# Patient Record
Sex: Female | Born: 1950 | Race: White | Hispanic: No | Smoking: Never smoker
Health system: Southern US, Community
[De-identification: ages and names within clinical notes are randomized; demographics above are authoritative.]

## PROBLEM LIST (undated history)

## (undated) DIAGNOSIS — K589 Irritable bowel syndrome without diarrhea: Secondary | ICD-10-CM

## (undated) DIAGNOSIS — D804 Selective deficiency of immunoglobulin M [IgM]: Secondary | ICD-10-CM

## (undated) DIAGNOSIS — D801 Nonfamilial hypogammaglobulinemia: Secondary | ICD-10-CM

## (undated) DIAGNOSIS — D802 Selective deficiency of immunoglobulin A [IgA]: Secondary | ICD-10-CM

## (undated) DIAGNOSIS — D803 Selective deficiency of immunoglobulin G [IgG] subclasses: Secondary | ICD-10-CM

## (undated) DIAGNOSIS — T7840XA Allergy, unspecified, initial encounter: Secondary | ICD-10-CM

## (undated) DIAGNOSIS — I10 Essential (primary) hypertension: Secondary | ICD-10-CM

## (undated) DIAGNOSIS — K5792 Diverticulitis of intestine, part unspecified, without perforation or abscess without bleeding: Secondary | ICD-10-CM

## (undated) HISTORY — DX: Selective deficiency of immunoglobulin m (igm): D80.4

## (undated) HISTORY — PX: CHOLECYSTECTOMY: SHX55

## (undated) HISTORY — DX: Nonfamilial hypogammaglobulinemia: D80.1

## (undated) HISTORY — DX: Diverticulitis of intestine, part unspecified, without perforation or abscess without bleeding: K57.92

## (undated) HISTORY — PX: COLONOSCOPY: SHX174

## (undated) HISTORY — DX: Selective deficiency of immunoglobulin g (igg) subclasses: D80.3

## (undated) HISTORY — DX: Irritable bowel syndrome, unspecified: K58.9

## (undated) HISTORY — DX: Selective deficiency of immunoglobulin a (iga): D80.2

## (undated) HISTORY — DX: Allergy, unspecified, initial encounter: T78.40XA

---

## 1997-04-15 ENCOUNTER — Other Ambulatory Visit: Admission: RE | Admit: 1997-04-15 | Discharge: 1997-04-15 | Payer: Self-pay | Admitting: *Deleted

## 1999-01-26 ENCOUNTER — Emergency Department (HOSPITAL_COMMUNITY): Admission: EM | Admit: 1999-01-26 | Discharge: 1999-01-27 | Payer: Self-pay | Admitting: Emergency Medicine

## 1999-01-27 ENCOUNTER — Encounter: Payer: Self-pay | Admitting: Emergency Medicine

## 1999-03-22 ENCOUNTER — Other Ambulatory Visit: Admission: RE | Admit: 1999-03-22 | Discharge: 1999-03-22 | Payer: Self-pay | Admitting: *Deleted

## 2000-10-16 ENCOUNTER — Other Ambulatory Visit: Admission: RE | Admit: 2000-10-16 | Discharge: 2000-10-16 | Payer: Self-pay | Admitting: *Deleted

## 2000-11-11 ENCOUNTER — Emergency Department (HOSPITAL_COMMUNITY): Admission: EM | Admit: 2000-11-11 | Discharge: 2000-11-11 | Payer: Self-pay | Admitting: Emergency Medicine

## 2002-01-03 ENCOUNTER — Other Ambulatory Visit: Admission: RE | Admit: 2002-01-03 | Discharge: 2002-01-03 | Payer: Self-pay | Admitting: *Deleted

## 2002-02-15 ENCOUNTER — Emergency Department (HOSPITAL_COMMUNITY): Admission: EM | Admit: 2002-02-15 | Discharge: 2002-02-15 | Payer: Self-pay | Admitting: Emergency Medicine

## 2002-02-15 ENCOUNTER — Encounter: Payer: Self-pay | Admitting: Emergency Medicine

## 2002-07-18 ENCOUNTER — Ambulatory Visit (HOSPITAL_COMMUNITY): Admission: RE | Admit: 2002-07-18 | Discharge: 2002-07-18 | Payer: Self-pay | Admitting: Gastroenterology

## 2002-07-18 ENCOUNTER — Encounter (INDEPENDENT_AMBULATORY_CARE_PROVIDER_SITE_OTHER): Payer: Self-pay | Admitting: Specialist

## 2002-10-17 ENCOUNTER — Encounter: Admission: RE | Admit: 2002-10-17 | Discharge: 2002-10-17 | Payer: Self-pay | Admitting: Gastroenterology

## 2002-10-17 ENCOUNTER — Encounter: Payer: Self-pay | Admitting: Gastroenterology

## 2003-02-06 ENCOUNTER — Other Ambulatory Visit: Admission: RE | Admit: 2003-02-06 | Discharge: 2003-02-06 | Payer: Self-pay | Admitting: *Deleted

## 2004-02-06 ENCOUNTER — Ambulatory Visit (HOSPITAL_COMMUNITY): Admission: RE | Admit: 2004-02-06 | Discharge: 2004-02-06 | Payer: Self-pay | Admitting: Orthopedic Surgery

## 2005-09-27 ENCOUNTER — Ambulatory Visit (HOSPITAL_COMMUNITY): Admission: RE | Admit: 2005-09-27 | Discharge: 2005-09-27 | Payer: Self-pay | Admitting: Obstetrics and Gynecology

## 2005-10-11 ENCOUNTER — Encounter: Admission: RE | Admit: 2005-10-11 | Discharge: 2005-10-11 | Payer: Self-pay | Admitting: Obstetrics and Gynecology

## 2006-07-12 ENCOUNTER — Encounter: Admission: RE | Admit: 2006-07-12 | Discharge: 2006-07-12 | Payer: Self-pay | Admitting: Gastroenterology

## 2007-08-15 ENCOUNTER — Encounter: Admission: RE | Admit: 2007-08-15 | Discharge: 2007-08-15 | Payer: Self-pay | Admitting: *Deleted

## 2007-08-30 ENCOUNTER — Encounter: Admission: RE | Admit: 2007-08-30 | Discharge: 2007-08-30 | Payer: Self-pay | Admitting: Surgery

## 2007-09-22 ENCOUNTER — Emergency Department (HOSPITAL_COMMUNITY): Admission: EM | Admit: 2007-09-22 | Discharge: 2007-09-23 | Payer: Self-pay | Admitting: Emergency Medicine

## 2009-02-20 IMAGING — CT CT ABDOMEN W/ CM
2 of 5 series · 17 of 46 positions shown, 19 images · IV contrast (APPLIED)
Comparison: 07/12/2006

CT ABDOMEN

CLINICAL DATA: Abdominal pain, nausea, right upper quadrant pain,
recent cholecystectomy

CT ABDOMEN AND PELVIS WITH CONTRAST
TECHNIQUE: Multidetector CT imaging of the abdomen and pelvis was
performed using the standard protocol following bolus
administration of intravenous contrast. Sagittal and coronal images
reconstructed from axial data set.
Contrast: Dilute oral contrast and 125 ml 3mnipaque-3SS

[Series 2: abd_pel 5.0 b40f st · axial · 0.64mm/px · z∈[-408,-32]mm · 14 of 85 slices shown, 16 images]
[im 5/85  soft-tissue]
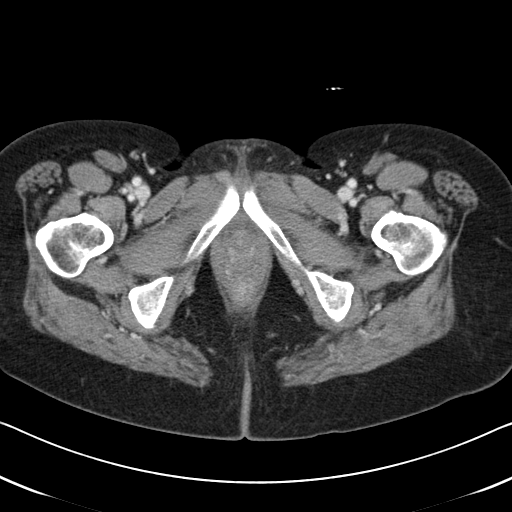
[im 5/85  bone]
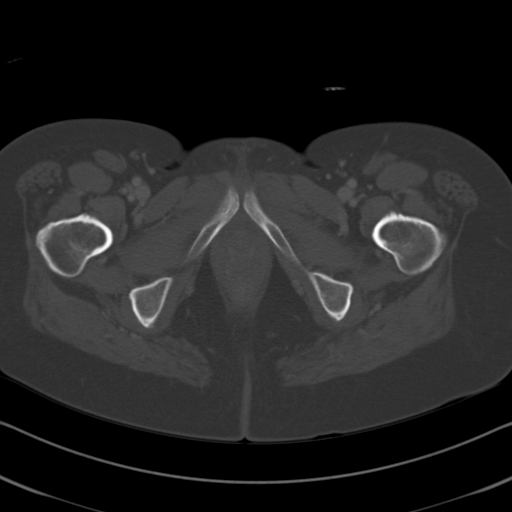
[im 9/85  soft-tissue]
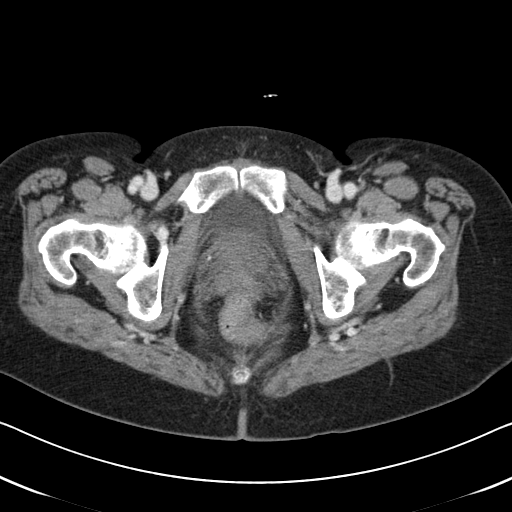
[im 18/85  soft-tissue]
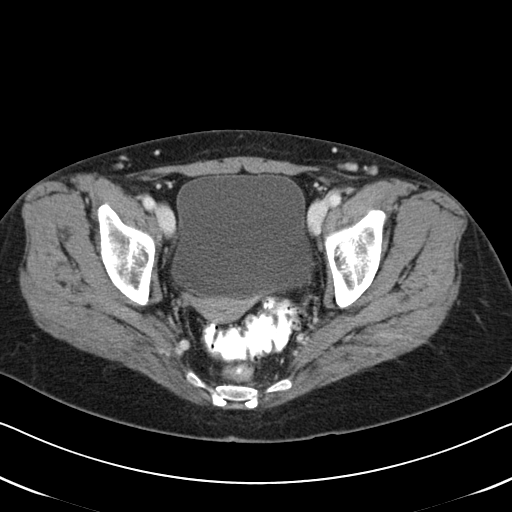
[im 23/85  soft-tissue]
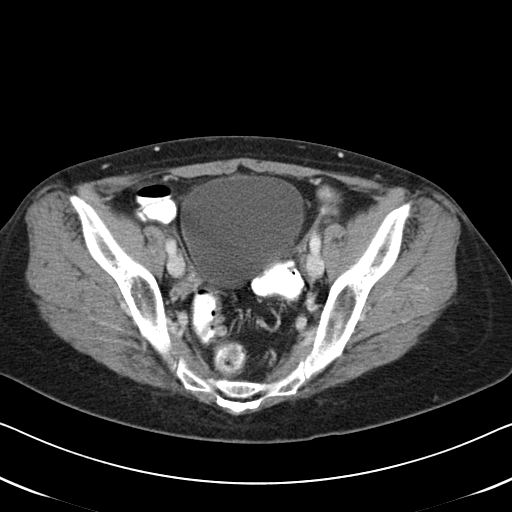
[im 27/85  soft-tissue]
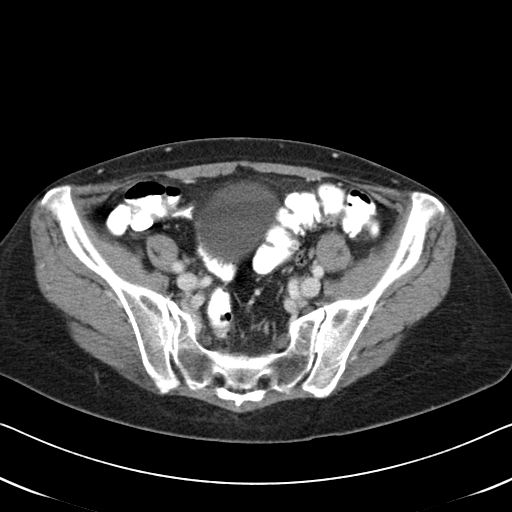
[im 36/85  soft-tissue]
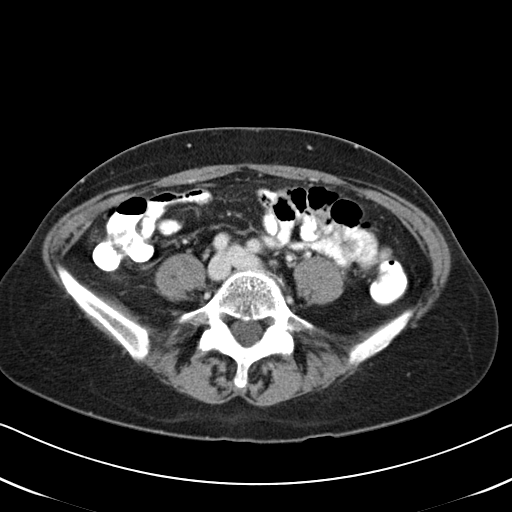
[im 40/85  soft-tissue]
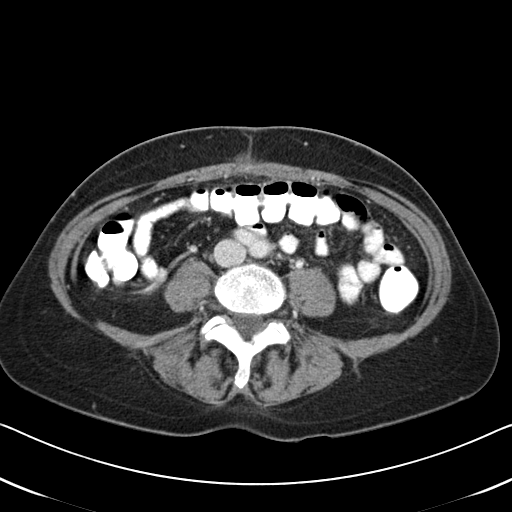
[im 45/85  soft-tissue]
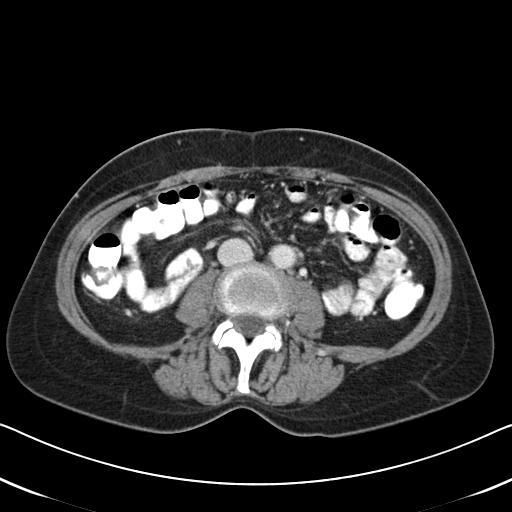
[im 49/85  soft-tissue]
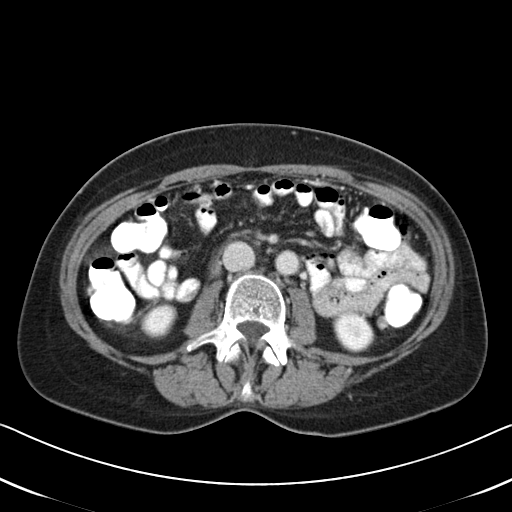
[im 49/85  bone]
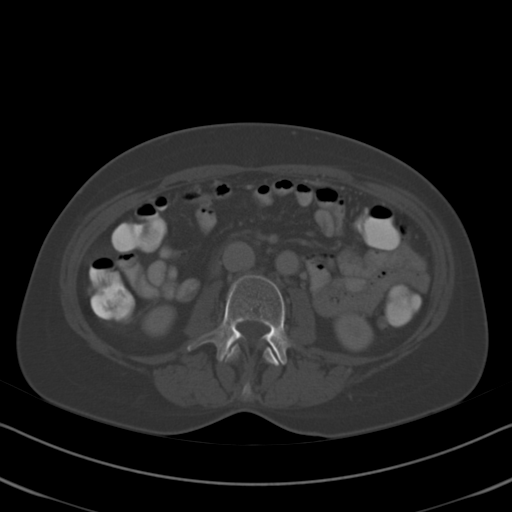
[im 58/85  soft-tissue]
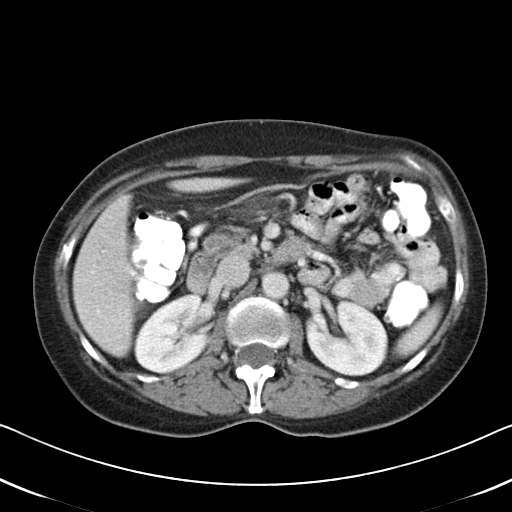
[im 62/85  soft-tissue]
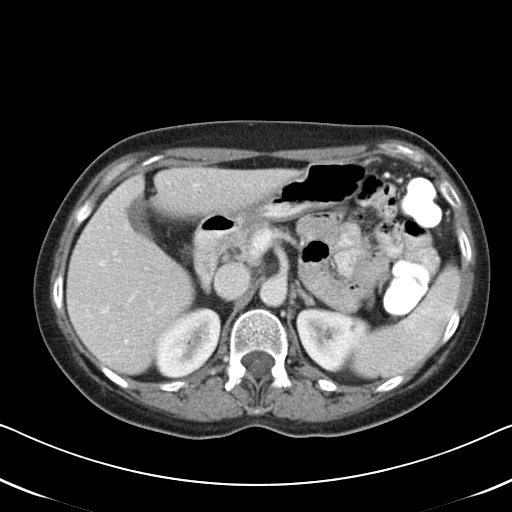
[im 67/85  soft-tissue]
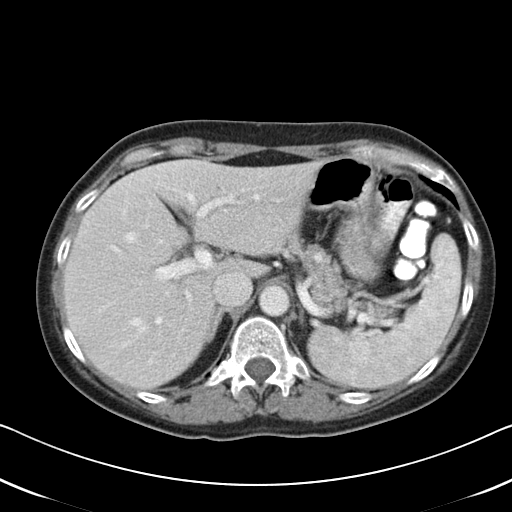
[im 76/85  soft-tissue]
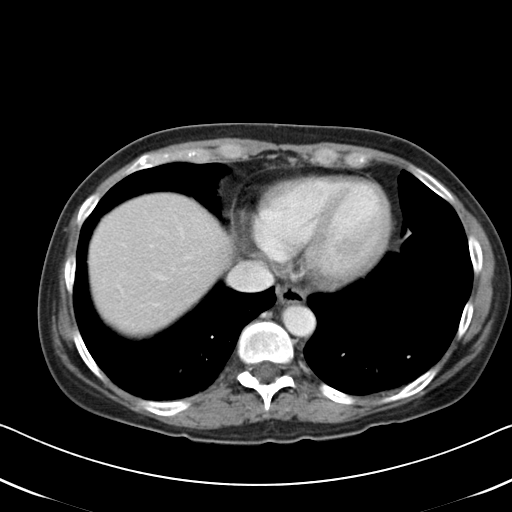
[im 80/85  soft-tissue]
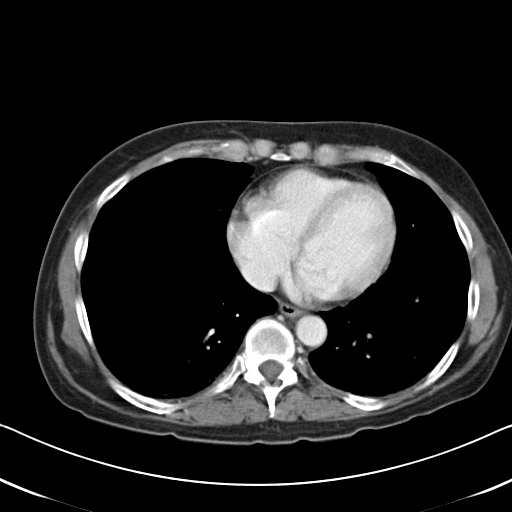

[Series 602: coronal abdmoen · coronal · 0.86mm/px · 3 of 102 slices shown]
[im 34/102  soft-tissue]
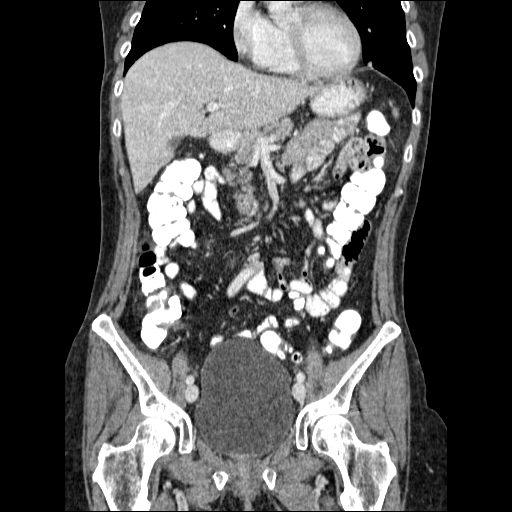
[im 45/102  soft-tissue]
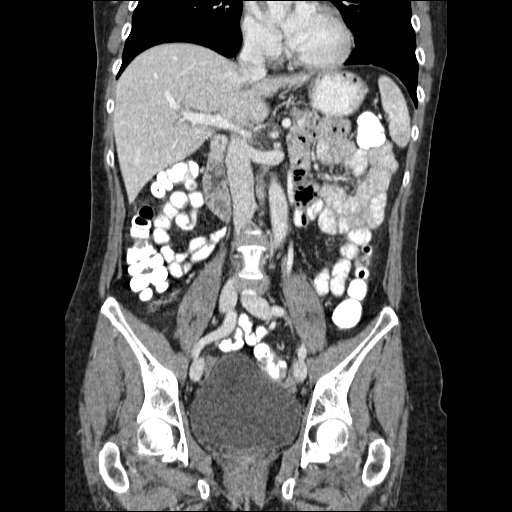
[im 57/102  soft-tissue]
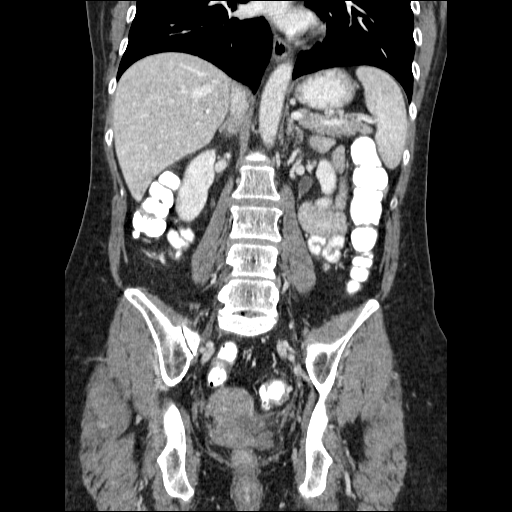

[17 of 46 positions shown; findings below may reference images not displayed]

FINDINGS: Minimal atelectasis dependently right lower lobe.
Status post cholecystectomy with minimal fluid in gallbladder
fossa, 2.4 x 1.0 cm image 24.
Minimal stranding of right upper quadrant tissue planes compatible
with recent surgery.
No focal abnormalities of the liver, spleen, pancreas, kidneys, or
adrenal glands.
Stomach and upper abdominal bowel loops normal.
No definite mass, adenopathy, or free fluid.
IMPRESSION: Postsurgical changes of cholecystectomy with minimal fluid at
gallbladder fossa.
No other upper abdominal abnormalities.

CT PELVIS
FINDINGS: Mild sigmoid diverticulosis without evidence of diverticulitis.
Unremarkable appearance of well distended bladder, uterus, and
adnexae.
Pelvic bowel loops otherwise unremarkable.
No pelvic mass, adenopathy, or free fluid.
Minimal infiltrative changes in the subcutaneous fat extending to
the anterior abdominal wall fascia and umbilicus, compatible with
preceding laparoscopic procedure.
Bones unremarkable.
IMPRESSION: No acute intrapelvic abnormalities.
Mild sigmoid diverticulosis.

## 2010-02-14 ENCOUNTER — Encounter: Payer: Self-pay | Admitting: Obstetrics and Gynecology

## 2010-02-14 ENCOUNTER — Encounter: Payer: Self-pay | Admitting: Family Medicine

## 2010-04-15 ENCOUNTER — Other Ambulatory Visit: Payer: Self-pay | Admitting: Gastroenterology

## 2010-04-15 DIAGNOSIS — R197 Diarrhea, unspecified: Secondary | ICD-10-CM

## 2010-04-19 ENCOUNTER — Ambulatory Visit
Admission: RE | Admit: 2010-04-19 | Discharge: 2010-04-19 | Disposition: A | Discharge status: Home / Self Care | Payer: BC Managed Care – PPO | Source: Ambulatory Visit | Attending: Gastroenterology | Admitting: Gastroenterology

## 2010-04-19 DIAGNOSIS — R197 Diarrhea, unspecified: Secondary | ICD-10-CM

## 2010-04-19 MED ORDER — IOHEXOL 300 MG/ML  SOLN
100.0000 mL | Freq: Once | INTRAMUSCULAR | Status: AC | PRN
  Administered 2010-04-19: 100 mL via INTRAVENOUS

## 2010-06-11 NOTE — Op Note (Signed)
NAME:  Amanda Mayer, Amanda Mayer                        ACCOUNT NO.:  [id number]   MEDICAL RECORD NO.:  [id number]                   PATIENT TYPE:  AMB   LOCATION:  ENDO                                 FACILITY:  [facility]   PHYSICIAN:  Petra Kuba, M.D.                 DATE OF BIRTH:  May 30, 1950   DATE OF PROCEDURE:  07/18/2002  DATE OF DISCHARGE:                                 OPERATIVE REPORT   PROCEDURE:  Colonoscopy with biopsy.   INDICATIONS:  Lower bowel gas, due for colonic screening.  Consent was  signed after risks, benefits, methods, and options thoroughly discussed in  the office.   MEDICINES USED:  Demerol 80 mg, Versed 8 mg.   PROCEDURE:  Rectal inspection was pertinent for external hemorrhoids, small.  Digital exam was negative.  The pediatric video adjustable colonoscope was  inserted and advanced around the colon to the cecum.  This did not require  any abdominal pressure or any position changes.  On insertion, left and  right diverticula were seen.  The cecum was identified by the appendiceal  orifice and the ileocecal valve; in fact, the scope was inserted a short way  into the terminal ileum, which was normal.  Photo documentation was  obtained, and the scope was slowly withdrawn.  Prep was adequate.  There was  some liquid stool that required washing and suctioning.  On slow withdrawal  through the colon, scattered diverticula throughout were confirmed on both  the right and left sides.  In the distal sigmoid a tiny probable  hyperplastic-appearing polyp was seen and was cold biopsied x2.  The scope  was further withdrawn, no other abnormalities were seen as we slowly drew  back to the rectum.  Once back in the rectum the scope was retroflexed,  which revealed some small internal hemorrhoids.  Anorectal pull-through  confirmed the above.  The scope was reinserted a short way up the left side  of the colon, air was suctioned, and the scope removed.  The patient  tolerated the procedure well, and there was no obvious immediate  complication.   ENDOSCOPIC DIAGNOSES:  1. Internal-external hemorrhoids.  2. Scattered left and right diverticula.  3. Tiny distal sigmoid polyp, cold biopsied.  4. Otherwise within normal limits to the terminal ileum.   PLAN:  Await pathology to determine future colonic screening.  Probably  recheck in five years.  Follow up p.r.n. or in two to three months to  recheck symptoms and make sure no further workup plans or medicine options  are needed.                                               Petra Kuba, M.D.    MEM/MEDQ  D:  07/18/2002  T:  07/18/2002  Job:  [id number]

## 2010-06-30 ENCOUNTER — Other Ambulatory Visit: Payer: Self-pay | Admitting: Gastroenterology

## 2012-02-25 DIAGNOSIS — A071 Giardiasis [lambliasis]: Secondary | ICD-10-CM | POA: Insufficient documentation

## 2013-12-31 ENCOUNTER — Emergency Department (HOSPITAL_COMMUNITY)
Admission: EM | Admit: 2013-12-31 | Discharge: 2013-12-31 | Disposition: A | Discharge status: Home / Self Care | Payer: BC Managed Care – PPO | Attending: Emergency Medicine | Admitting: Emergency Medicine

## 2013-12-31 ENCOUNTER — Emergency Department (HOSPITAL_COMMUNITY): Payer: BC Managed Care – PPO

## 2013-12-31 ENCOUNTER — Encounter (HOSPITAL_COMMUNITY): Payer: Self-pay | Admitting: Emergency Medicine

## 2013-12-31 DIAGNOSIS — Z23 Encounter for immunization: Secondary | ICD-10-CM | POA: Diagnosis not present

## 2013-12-31 DIAGNOSIS — W231XXA Caught, crushed, jammed, or pinched between stationary objects, initial encounter: Secondary | ICD-10-CM | POA: Diagnosis not present

## 2013-12-31 DIAGNOSIS — S61200A Unspecified open wound of right index finger without damage to nail, initial encounter: Secondary | ICD-10-CM | POA: Diagnosis not present

## 2013-12-31 DIAGNOSIS — Y9389 Activity, other specified: Secondary | ICD-10-CM | POA: Diagnosis not present

## 2013-12-31 DIAGNOSIS — T1490XA Injury, unspecified, initial encounter: Secondary | ICD-10-CM

## 2013-12-31 DIAGNOSIS — S61209A Unspecified open wound of unspecified finger without damage to nail, initial encounter: Secondary | ICD-10-CM

## 2013-12-31 DIAGNOSIS — Y998 Other external cause status: Secondary | ICD-10-CM | POA: Diagnosis not present

## 2013-12-31 DIAGNOSIS — S61210A Laceration without foreign body of right index finger without damage to nail, initial encounter: Secondary | ICD-10-CM | POA: Diagnosis present

## 2013-12-31 DIAGNOSIS — I10 Essential (primary) hypertension: Secondary | ICD-10-CM | POA: Insufficient documentation

## 2013-12-31 DIAGNOSIS — Y9289 Other specified places as the place of occurrence of the external cause: Secondary | ICD-10-CM | POA: Diagnosis not present

## 2013-12-31 HISTORY — DX: Essential (primary) hypertension: I10

## 2013-12-31 MED ORDER — HYDROCODONE-ACETAMINOPHEN 5-325 MG PO TABS: 1.0000 | ORAL_TABLET | Freq: Four times a day (QID) | ORAL | Status: DC | PRN

## 2013-12-31 MED ORDER — HYDROCODONE-ACETAMINOPHEN 5-325 MG PO TABS
1.0000 | ORAL_TABLET | Freq: Once | ORAL | Status: AC
  Administered 2013-12-31: 1 via ORAL
  Filled 2013-12-31: qty 1

## 2013-12-31 MED ORDER — TETANUS-DIPHTH-ACELL PERTUSSIS 5-2.5-18.5 LF-MCG/0.5 IM SUSP
0.5000 mL | Freq: Once | INTRAMUSCULAR | Status: AC
  Administered 2013-12-31: 0.5 mL via INTRAMUSCULAR
  Filled 2013-12-31: qty 0.5

## 2013-12-31 NOTE — Discharge Instructions (Signed)
Deep Skin Avulsion °A deep skin avulsion is when all layers of the skin or parts of body structures have been torn away. This is usually a result of severe injury (trauma). A deep skin avulsion can include damage to important structures beneath the skin such as tendons, ligaments, nerves, or blood vessels.  °CAUSES  °Many injuries can lead to a deep skin avulsion. These include:  °· Crush injuries. °· Bites. °· Falls against jagged surfaces. °· Gunshot wounds. °· Severe burns and injuries involving dragging (such as those from a bicycle or motorcycle accident). °TREATMENT  °· If the wound is small and there is no damage to vital structures like nerves and blood vessels, the damaged tissues may be removed. Then, the wound can be cleaned thoroughly and closed. °· A skin graft may be performed. This is a procedure in which the outer layer of skin is removed from a different part of your body. That skin (skin graft) is used to cover the open wound. This can happen after damaged tissue is removed and repairs are completed. °· Your caregiver may only apply a bandage (dressing) to the wound. The wound will be kept clean and allowed to heal. Healing can take weeks or months and usually leaves a large scar. This type of treatment is only done if your caregiver feels that skin grafting or a similar procedure would not work. °You might need a tetanus shot if: °· You cannot remember when you had your last tetanus shot. °· You have never had a tetanus shot. °· The injury broke your skin. °If you got a tetanus shot, your arm may swell, get red, and feel warm to the touch. This is common and not a problem. If you need a tetanus shot and you choose not to have one, there is a rare chance of getting tetanus. Sickness from tetanus can be serious. °HOME CARE INSTRUCTIONS  °· Only take over-the-counter or prescription medicines for pain, discomfort, or fever as directed by your caregiver. °· Gently wash the area with mild soap and  water 2 times a day, or as directed. Rinse off the soap. Pat the area dry with a clean towel. Do not rub the wound. This may cause bleeding. °· Follow your caregiver's instructions for how often you need to change the dressing. °· Apply ointment and a dressing to the wound as directed. °· If the dressing sticks, moisten it with soapy water and gently remove it. °· Change the bandage right away if it becomes wet, dirty, or starts to smell bad. °· Take showers. Do not take tub baths, swim, or do anything that may soak the wound until it is healed. °· Use anti-itch medicine as directed by your caregiver. The wound may itch when it is healing. Do not pick or scratch at the wound. °· Follow up with your caregiver for stitches (sutures), staple, or skin adhesive strip removal. °SEEK MEDICAL CARE IF:  °· You have redness, swelling, or increasing pain in your wound. °· A red streak or line extends away from the wound. °· You have pus coming from the wound. °· You notice a bad smell coming from the wound or dressing. °· The wound breaks open (edges not staying together) after sutures have been removed. °· You notice something coming out of the wound, such as a small piece of wood, glass, or metal. °· You are unable to properly move a finger or toe if the wound is on your hand or foot. °· You have severe   swelling around the wound that causes pain and numbness. °· Your arm, hand, leg, or foot changes color. °SEEK IMMEDIATE MEDICAL CARE IF:  °· Your pain becomes severe or is not adequately relieved with pain medicine. °· You have a fever. °· You have nausea and vomiting for more than 24 hours. °· You feel lightheaded, weak, or faint. °· You develop chest pain or difficulty breathing. °MAKE SURE YOU:  °· Understand these instructions. °· Will watch your condition. °· Will get help right away if you are not doing well or get worse. °Document Released: 03/08/2006 Document Revised: 04/04/2011 Document Reviewed:  05/16/2010 °ExitCare® Patient Information ©2015 [employer]. This information is not intended to replace advice given to you by your health care provider. Make sure you discuss any questions you have with your health care provider. ° ° °Emergency Department Resource Guide °1) Find a Doctor and Pay Out of Pocket °Although you won't have to find out who is covered by your insurance plan, it is a good idea to ask around and get recommendations. You will then need to call the office and see if the doctor you have chosen will accept you as a new patient and what types of options they offer for patients who are self-pay. Some doctors offer discounts or will set up payment plans for their patients who do not have insurance, but you will need to ask so you aren't surprised when you get to your appointment. ° °2) Contact Your Local Health Department °Not all health departments have doctors that can see patients for sick visits, but many do, so it is worth a call to see if yours does. If you don't know where your local health department is, you can check in your phone book. The CDC also has a tool to help you locate your state's health department, and many state websites also have listings of all of their local health departments. ° °3) Find a Walk-in Clinic °If your illness is not likely to be very severe or complicated, you may want to try a walk in clinic. These are popping up all over the country in pharmacies, drugstores, and shopping centers. They're usually staffed by nurse practitioners or physician assistants that have been trained to treat common illnesses and complaints. They're usually fairly quick and inexpensive. However, if you have serious medical issues or chronic medical problems, these are probably not your best option. ° °No Primary Care Doctor: °- Call [facility] at  832-8000 - they can help you locate a primary care doctor that  accepts your insurance, provides certain services, etc. °- Physician  Referral Service- 1-800-533-3463 ° °Chronic Pain Problems: °Organization         Address  Phone   Notes  °[facility]  (336) 297-2271 Patients need to be referred by their primary care doctor.  ° °Medication Assistance: °Organization         Address  Phone   Notes  °[place] [facility] [address] °[place], [place] [address] (336) 641-8030 --Must be a resident of [place] °-- Must have NO insurance coverage whatsoever (no Medicaid/ Medicare, etc.) °-- The pt. MUST have a primary care doctor that directs their care regularly and follows them in the community °  °[facility]  (866) 331-1348   °[employer]  (888) 892-1162   ° °Agencies that provide inexpensive medical care: °Organization         Address  Phone   Notes  °  [facility]  (336) 832-8035   °[facility]    (336) 832-7272   °[facility] [address] °[place], [place] [address] (336) 832-4777   °[facility] [address], °[place] (336) 271-4999   °[facility]    (336) 373-0678   °[facility]    (336) 272-1050   °[facility] ° [address], [place] Phone:  (336) 832-4444, Fax:  (336) 832-4440 Hours of Operation:  9 am - 6 pm, M-F.  Also accepts Medicaid/Medicare and self-pay.  °[facility] ° [address], [place] Phone: (336) 832-3150, Fax: (336) [phone]. Hours of Operation:  8:30 am - 5:30 pm, M-F.  Also accepts Medicaid and self-pay.  °[facility] [place] [address], [place] Phone: (336) 878-6027   °[facility] [address], [place], [place] (336)723-1848, Ext. 123 Mondays & Thursdays: 7-9 AM.  First 15 patients are seen on a first come, first serve basis. °  ° °Medicaid-accepting [place] Providers: ° °Organization         Address  Phone   Notes  °[facility] [address], [place] (336) 641-2100 Also accepts self-pay patients.  °[facility] [address], [place] ° (336) 856-9996   °[facility] [address], [place] (336) 288-8857   °[facility] [address] [place] [address], [place] (336) 299-7000   °Veita Bland [address], [place]  ° (336) 373-1557 Only accepts [place] Access Medicaid patients after they have their name applied to their card.  ° °Self-Pay (no insurance) in [place]: ° °Organization         Address  Phone   Notes  °Sickle Cell Patients, [facility] [address], [place] (336) 832-1970   °[facility] [address], [place] (336) 832-4400   °[facility] [place] ° [address], [place] (336) 992-4800   °[facility]/Dr. Osei-Bonsu ° [address] [place] [address], [place] or [address], [place] (336) 841-8500 Phone number for both [place] and [place] locations is the same.  °[facility] [address], [place] (336) 299-0000   °[facility] [address] [place] [address], [place] or [address] (336) 852-7530 °(336) 878-2260   °[facility] [address], [place] (336) 350-1642, phone; (336) 294-5005, fax Sees patients 1st and 3rd Saturday of every month.  Must not qualify for public or private insurance (i.e. Medicaid, Medicare, [place] Health Choice, Veterans' Benefits) • Household income should be no more than 200% of the poverty level •The clinic cannot treat you if you are pregnant or think you are pregnant • Sexually transmitted diseases are not treated at the clinic.  ° ° °Dental Care: °Organization         Address  Phone  Notes  °[facility] [address], [place] (336) 641-6152 Accepts children up to age 21 who are enrolled  in Medicaid or [place] Health Choice; pregnant women with a Medicaid card; and children who have applied for Medicaid or  Health Choice, but were declined, whose parents can pay a reduced fee at time of service.  °[place] [place]  [address], [place] (336) 641-7733 Accepts children up to age 21 who are enrolled in Medicaid or [place] Health Choice; pregnant women with a Medicaid card; and children who have applied for Medicaid or [place] Health Choice, but were declined, whose parents can pay a reduced fee at time of service.  °[facility] ° [address], [place] (336) 641-4533 Patients are seen by appointment only. Walk-ins are not accepted. [facility] will see patients 18 years of age and older. °Monday - Tuesday (8am-5pm) °Most Wednesdays (8:30-5pm) °$30 per visit, cash only  °[facility] ° [address], [place] (336) 641-4533 Patients are seen by appointment only. Walk-ins are not accepted. [facility] will see patients 18 years of age and older. °One Wednesday Evening (Monthly: Volunteer Based).  $30 per visit, cash only  °[facility]  (919) 537-3737 for adults; Children under age 4, call Graduate Pediatric Dentistry at ([phone]) [phone]. Children aged 4-14, please call ([phone]) [phone] to request a pediatric application. ° Dental services are provided in all areas of dental care including fillings, crowns and bridges, complete and partial dentures, implants, gum treatment, root canals, and extractions. Preventive care is also provided. Treatment is provided to both adults and children. °Patients are selected via a lottery and there is often a waiting list. °  °[facility] [address], °[place] ° (336) 763-8833 [email or web address] °  °[facility] [address], [place], [place] (336)723-1848, Ext. 123 Second and Fourth Thursday of each month, opens at  6:30 AM; Clinic ends at 9 AM.  Patients are seen on a first-come first-served basis, and a limited number are seen during each clinic.  ° °[facility] ° [address], [place], [place] (336) 723-7904   Eligibility Requirements °You must have lived in [place], [place], or [place] counties for at least the last three months. °  You cannot be eligible for state or federal sponsored healthcare insurance, including Veterans Administration, Medicaid, or Medicare. °  You generally cannot be eligible for healthcare insurance through your employer.  °  How to apply: °Eligibility screenings are held every Tuesday and Wednesday afternoon from 1:00 pm until 4:00 pm. You do not need an appointment for the interview!  °[facility] [address], [place], [place] 336-631-2330   °[facility]  336-342-8273   °[facility]  336-703-3100   °[facility]  336-570-6415   ° °Behavioral Health Resources in the Community: °Intensive Outpatient Programs °Organization         Address  Phone  Notes  °[facility] [address], [place], [place] 336-878-6098   °[facility] [address], [place], [place] 336-832-9800   °[facility] [address], [place], [place] ° 336-882-2125   °[facility] [address],  °[place], [place] 1-800-853-5163 or 336-641-4981   °Substance Abuse Resources °Organization         Address  Phone  Notes  °[facility]  336-882-2125   °[facility]  336-784-9470   °[facility]  336-285-9073   °[facility]  336-845-3988   °Residential & Outpatient Substance Abuse Program  1-800-659-3381   °Psychological Services °Organization         Address  Phone  Notes  °[facility]    336- 832-9600   °[facility]  336- 378-7881   °[facility] [address], [place]  1-800-853-5163 or 336-641-4981   ° °Mobile Crisis Teams °Organization         Address  Phone  Notes  °[facility], Mobile Crisis Care Unit  1-877-626-1772   °Assertive °[facility] ° [address] [place], [place] 336-834-9664   °Sharon DeEsch [address] °[place] 336-554-5454   ° °Self-Help/Support Groups °Organization         Address  Phone             Notes  °[facility] - variety of support groups  336- 373-1402 Call for more information  °Narcotics Anonymous (NA), [facility] [address], °[place]  2 meetings at this location  ° °Residential Treatment Programs °Organization         Address  Phone  Notes  °[facility] [address],    °[place]  1-866-801-8205   °[facility] ° [address], [place], [place] 704-293-8524   °[facility] [address], [place] 336-845-3988 Admissions: 8am-3pm M-F  °[facility] [address]    °[place], [place] 336-841-1104   °[facility] [address], [place], [place] 336-379-7146   °[facility] [address]  °[place], [place] 336-285-9073   °[facility] - Intensive Outpatient [address], [place], [place] 336-852-3033   °[facility] [address]  °[place], [place] 1-877-615-2722 or 336-784-9470   °[facility] [address] [place], [place] 336-227-7417 Accepts Medicaid  °[facility] [address]  °[place] 1-800-659-3381 Substance Abuse/Addiction Treatment  ° °[place] Behavioral Health Resources °Organization         Address  Phone  Notes  °[facility]  (888) 581-9988   °Julie Brannon, PhD [address] [place], [place]   (336) 349-5553 or (336) 951-0000   °[facility]   [address] °[place], [place] (336) 349-4454   °[facility]  [address], [place], [place] (336) 342-8316 Insurance/Medicaid/sponsorship through [facility]  °[facility] [address]                                    [place], [place] (336) 342-8316 Therapy/tele-psych/case  °[facility] [address]  ° [place], [place] (336) 349-2233    °Dr. Arfeen  (336) 349-4544   °[facility] [place]  [employer] [place] [facility] 1) [address], [place] °2) [address], [place] °3)  [address], [place] (336) 349-3220 °(336) 342-7768 ° °(336) 342-8140   °[place] [facility] (336) 342-1394 or (336) 342-3537 (After Hours)    ° ° ° °

## 2013-12-31 NOTE — ED Provider Notes (Signed)
CSN: [id number]     Arrival date & time 12/31/13  1304 History  This chart was scribed for Terri Piedraourtney Forcucci, PA-C, working with Doug SouSam Jacubowitz, MD by Jolene Provostobert Halas, ED Scribe. This patient was seen in room WTR5/WTR5 and the patient's care was started at 1:19 PM.     Chief Complaint  Patient presents with  . Extremity Laceration    laceration to index finger r/hand    HPI  HPI Comments: Baldemar Lenismelia M Dorantes is a 63 y.o. female who presents to the Emergency Department complaining of a throbbing, piercing right index finger pain that began 1.5 hours ago after a finger injury. Pt states she slammed her finger in a door. Pt states she is feeling shocky, and cannot stop crying when she looks at her finger.  Pt endorses associated numbness, tingling, and bleeding at the site of injury. Pt states she has kept ice on her finger since the injury. Pt takes BP medication. Pt takes welchol prn. Pt is allergic to codeine. Pt has not taken any medications PTA.    Past Medical History  Diagnosis Date  . Hypertension    Past Surgical History  Procedure Laterality Date  . Cholecystectomy     Family History  Problem Relation Age of Onset  . Hypertension Mother   . Hypertension Father   . Diabetes Sister   . Diabetes Brother    History  Substance Use Topics  . Smoking status: Never Smoker   . Smokeless tobacco: Not on file  . Alcohol Use: Yes     Comment: occ   OB History    No data available     Review of Systems  Skin: Positive for wound.  Neurological: Positive for numbness.       Tingling.  All other systems reviewed and are negative.   Allergies  Codeine  Home Medications   Prior to Admission medications   Medication Sig Start Date End Date Taking? Authorizing Provider  HYDROcodone-acetaminophen (NORCO/VICODIN) 5-325 MG per tablet Take 1 tablet by mouth every 6 (six) hours as needed for moderate pain or severe pain. 12/31/13   Alyla Pietila A Forcucci, PA-C   BP 138/69 mmHg  Pulse 86   Temp(Src) 98 F (36.7 C) (Oral)  Resp 18  SpO2 100% Physical Exam  Constitutional: She is oriented to person, place, and time. She appears well-developed and well-nourished.  HENT:  Head: Normocephalic and atraumatic.  Eyes: Pupils are equal, round, and reactive to light.  Neck: Neck supple.  Cardiovascular: Normal rate, regular rhythm and normal heart sounds.  Exam reveals no gallop and no friction rub.   No murmur heard. 2+ right radial pulse.  Pulmonary/Chest: Effort normal and breath sounds normal. No respiratory distress. She has no wheezes.  Musculoskeletal: She exhibits edema and tenderness.  Exposed subcutaneous tissue from tip of right index finger, with bruising in nail bed. Bleeding is well controlled. Pt is able to actively flex and extend finger.  Neurological: She is alert and oriented to person, place, and time.  Skin: Skin is warm and dry.  Psychiatric: She has a normal mood and affect. Her behavior is normal.  Nursing note and vitals reviewed.          ED Course  Procedures  DIAGNOSTIC STUDIES: Oxygen Saturation is 100% on RA, normal by my interpretation.    COORDINATION OF CARE: 1:28 PM Discussed treatment plan with pt at bedside and pt agreed to plan.  Labs Review Labs Reviewed - No data to display  Imaging Review Dg Finger Index Right  12/31/2013   CLINICAL DATA:  63 year old female status post blunt trauma to the right index finger, closed in door. Laceration to tip. Initial encounter.  EXAM: RIGHT INDEX FINGER 2+V  COMPARISON:  None.  FINDINGS: Soft tissue injury about the tip of the right index finger. The underlying distal phalanx appears intact. Joint spaces and alignment within normal limits. No acute fracture identified.  IMPRESSION: Soft tissue injury without underlying fracture identified in the right index finger.   Electronically Signed   By: Augusto GambleLee  Hall M.D.   On: 12/31/2013 14:47     EKG Interpretation None      MDM   Final  diagnoses:  Injury  Avulsion of skin of finger, initial encounter   Patient is a 63 year old female who presents emergency room for evaluation of finger injury. Physical exam reveals neurovascularly intact finger injury with avulsion of tissue to the tip of the finger pad. Plain film x-ray reveals no acute fractures at this time. There are no open fractures. Patient thinks that she is up to date on her tetanus vaccination, but there is no documentation in our system. I will update her tetanus here today. This wound is not capable of being sutured at this time. I have soaked it here in 8 Betadine solution. I will place a nonadherent dressing on the wound and we'll splint the finger for comfort. Patient is to return for increased bleeding, signs of infection week which we have discussed, or any other concerning symptoms. She states understanding and agreement. I will give her information to follow up with Dr. Amanda PeaGramig our hand surgeon. I will send the patient home with hydrocodone for pain management. Patient is stable for discharge at this time.  I personally performed the services described in this documentation, which was scribed in my presence. The recorded information has been reviewed and is accurate.     Eben Burowourtney A Forcucci, PA-C 12/31/13 1525  Doug SouSam Jacubowitz, MD 12/31/13 1724

## 2013-12-31 NOTE — ED Notes (Signed)
Pt reports pain, laceration and swelling to r/index finger. Pt closed door on finger. Examined by PA

## 2015-06-13 DIAGNOSIS — K5732 Diverticulitis of large intestine without perforation or abscess without bleeding: Secondary | ICD-10-CM | POA: Insufficient documentation

## 2015-06-13 DIAGNOSIS — I10 Essential (primary) hypertension: Secondary | ICD-10-CM | POA: Insufficient documentation

## 2015-06-13 DIAGNOSIS — D803 Selective deficiency of immunoglobulin G [IgG] subclasses: Secondary | ICD-10-CM | POA: Insufficient documentation

## 2015-06-13 DIAGNOSIS — K589 Irritable bowel syndrome without diarrhea: Secondary | ICD-10-CM | POA: Insufficient documentation

## 2015-06-13 DIAGNOSIS — D804 Selective deficiency of immunoglobulin M [IgM]: Secondary | ICD-10-CM | POA: Insufficient documentation

## 2015-09-23 DIAGNOSIS — D801 Nonfamilial hypogammaglobulinemia: Secondary | ICD-10-CM | POA: Insufficient documentation

## 2016-04-29 DIAGNOSIS — F5104 Psychophysiologic insomnia: Secondary | ICD-10-CM | POA: Insufficient documentation

## 2016-10-14 ENCOUNTER — Encounter (INDEPENDENT_AMBULATORY_CARE_PROVIDER_SITE_OTHER): Payer: Self-pay

## 2016-10-14 ENCOUNTER — Encounter: Payer: Self-pay | Admitting: Family Medicine

## 2016-10-14 ENCOUNTER — Ambulatory Visit (INDEPENDENT_AMBULATORY_CARE_PROVIDER_SITE_OTHER): Payer: Medicare Other | Admitting: Family Medicine

## 2016-10-14 DIAGNOSIS — K589 Irritable bowel syndrome without diarrhea: Secondary | ICD-10-CM | POA: Diagnosis not present

## 2016-10-14 DIAGNOSIS — F419 Anxiety disorder, unspecified: Secondary | ICD-10-CM | POA: Insufficient documentation

## 2016-10-14 DIAGNOSIS — I10 Essential (primary) hypertension: Secondary | ICD-10-CM

## 2016-10-14 NOTE — Progress Notes (Signed)
 BP 132/88   Pulse 89   Temp 98.6 F (37 C) (Oral)   Ht 5' 2" (1.575 m)   Wt 127 lb (57.6 kg)   BMI 23.23 kg/m    Subjective:    Patient ID: Amanda Mayer, female    DOB: 02/08/1950, 66 y.o.   MRN: [id number]  HPI: Amanda Mayer is a 66 y.o. female presenting on 10/14/2016 for Establish Care   HPI Irritable bowel syndrome Patient is coming to establish care with our office. She has been diagnosed with irritable bowel syndrome and is on medication including WelChol and labor ask from her gastroenterologist to help control this. She says they do pretty well for her and she denies any blood in her stool or significant abdominal pain and that she relatively feels healthy.  Hypertension Patient is currently on losartan, and their blood pressure today is 132/88. Patient denies any lightheadedness or dizziness. Patient denies headaches, blurred vision, chest pains, shortness of breath, or weakness. Denies any side effects from medication and is content with current medication.   Anxiety Patient has a history of anxiety but she says right now she is doing well and is not on any medication necessarily for it. She does take the Lee Braxton for irritable bowel which helps some with her anxiety. She denies any suicidal ideations or thoughts of hurting herself. Depression screen PHQ 2/9 10/14/2016  Decreased Interest 0  Down, Depressed, Hopeless 0  PHQ - 2 Score 0   Relevant past medical, surgical, family and social history reviewed and updated as indicated. Interim medical history since our last visit reviewed. Allergies and medications reviewed and updated.  Review of Systems  Constitutional: Negative for chills and fever.  HENT: Negative for congestion, ear discharge and ear pain.   Eyes: Negative for redness and visual disturbance.  Respiratory: Negative for chest tightness and shortness of breath.   Cardiovascular: Negative for chest pain and leg swelling.  Gastrointestinal:  Positive for abdominal pain and diarrhea. Negative for abdominal distention, anal bleeding, nausea and vomiting.  Genitourinary: Negative for difficulty urinating and dysuria.  Musculoskeletal: Negative for back pain and gait problem.  Skin: Negative for rash.  Neurological: Negative for dizziness, light-headedness and headaches.  Psychiatric/Behavioral: Negative for agitation, behavioral problems, dysphoric mood, self-injury, sleep disturbance and suicidal ideas. The patient is not nervous/anxious.   All other systems reviewed and are negative.   Per HPI unless specifically indicated above  Social History   Social History  . Marital status: Widowed    Spouse name: N/A  . Number of children: N/A  . Years of education: N/A   Occupational History  . Not on file.   Social History Main Topics  . Smoking status: Never Smoker  . Smokeless tobacco: Never Used  . Alcohol use No  . Drug use: No  . Sexual activity: Not Currently   Other Topics Concern  . Not on file   Social History Narrative  . No narrative on file    Past Surgical History:  Procedure Laterality Date  . CHOLECYSTECTOMY      Family History  Problem Relation Age of Onset  . Hypertension Mother   . Hypertension Father   . Cancer Father        prostate  . Diabetes Sister   . Alcohol abuse Sister   . COPD Sister   . Miscarriages / Stillbirths Sister   . Diabetes Brother   . Alcohol abuse Brother   . Hypertension Brother   .   Hearing loss Maternal Grandfather   . Stroke Paternal Grandmother   . Early death Paternal Grandfather   . Alcohol abuse Brother   . COPD Brother   . Diabetes Brother   . Diabetes Daughter     Allergies as of 10/14/2016      Reactions   Codeine Other (See Comments), Rash   unknown hallucinations      Medication List       Accurate as of 10/14/16 10:04 AM. Always use your most recent med list.          clidinium-chlordiazePOXIDE 5-2.5 MG capsule Commonly known as:   LIBRAX Take 1 capsule by mouth 3 (three) times daily before meals.   colesevelam 625 MG tablet Commonly known as:  WELCHOL Take 625 mg by mouth daily with breakfast.   losartan 50 MG tablet Commonly known as:  COZAAR Take 50 mg by mouth daily.            Discharge Care Instructions        Start     Ordered   10/14/16 0000  Pneumococcal polysaccharide vaccine 23-valent greater than or equal to 2yo subcutaneous/IM     10/14/16 0941   10/14/16 0000  CMP14+EGFR     10/14/16 1004   10/14/16 0000  Lipid panel     10/14/16 1004   10/14/16 0000  CBC with Differential/Platelet     10/14/16 1004   10/14/16 0000  TSH     10/14/16 1004         Objective:    BP 132/88   Pulse 89   Temp 98.6 F (37 C) (Oral)   Ht 5' 2" (1.575 m)   Wt 127 lb (57.6 kg)   BMI 23.23 kg/m   Wt Readings from Last 3 Encounters:  10/14/16 127 lb (57.6 kg)    Physical Exam  Constitutional: She is oriented to person, place, and time. She appears well-developed and well-nourished. No distress.  Eyes: Conjunctivae are normal.  Neck: Neck supple. No thyromegaly present.  Cardiovascular: Normal rate, regular rhythm, normal heart sounds and intact distal pulses.   No murmur heard. Pulmonary/Chest: Effort normal and breath sounds normal. No respiratory distress. She has no wheezes. She has no rales.  Abdominal: Soft. Bowel sounds are normal. She exhibits no distension. There is no tenderness. There is no rebound and no guarding.  Musculoskeletal: Normal range of motion. She exhibits no edema or tenderness.  Lymphadenopathy:    She has no cervical adenopathy.  Neurological: She is alert and oriented to person, place, and time. Coordination normal.  Skin: Skin is warm and dry. No rash noted. She is not diaphoretic.  Psychiatric: She has a normal mood and affect. Her behavior is normal. She expresses no suicidal ideation. She expresses no suicidal plans.  Nursing note and vitals reviewed.   No results  found for this or any previous visit.    Assessment & Plan:   Problem List Items Addressed This Visit      Cardiovascular and Mediastinum   Hypertension   Relevant Medications   losartan (COZAAR) 50 MG tablet   colesevelam (WELCHOL) 625 MG tablet   Other Relevant Orders   CMP14+EGFR (Completed)   Lipid panel (Completed)     Digestive   IBS (irritable bowel syndrome)   Relevant Medications   clidinium-chlordiazePOXIDE (LIBRAX) 5-2.5 MG capsule     Other   Anxiety   Relevant Orders   CBC with Differential/Platelet (Completed)   TSH (Completed)  Follow up plan: Return if symptoms worsen or fail to improve.  Joshua Dettinger, MD [facility] 10/14/2016, 10:04 AM      

## 2016-10-15 LAB — CBC WITH DIFFERENTIAL/PLATELET
BASOS: 0 %
Basophils Absolute: 0 10*3/uL (ref 0.0–0.2)
EOS (ABSOLUTE): 0.2 10*3/uL (ref 0.0–0.4)
Eos: 3 %
Hematocrit: 38.9 % (ref 34.0–46.6)
Hemoglobin: 13.3 g/dL (ref 11.1–15.9)
IMMATURE GRANS (ABS): 0 10*3/uL (ref 0.0–0.1)
Immature Granulocytes: 0 %
LYMPHS ABS: 1.9 10*3/uL (ref 0.7–3.1)
Lymphs: 27 %
MCH: 29.2 pg (ref 26.6–33.0)
MCHC: 34.2 g/dL (ref 31.5–35.7)
MCV: 85 fL (ref 79–97)
MONOS ABS: 0.7 10*3/uL (ref 0.1–0.9)
Monocytes: 9 %
NEUTROS ABS: 4.3 10*3/uL (ref 1.4–7.0)
Neutrophils: 61 %
PLATELETS: 276 10*3/uL (ref 150–379)
RBC: 4.56 x10E6/uL (ref 3.77–5.28)
RDW: 14 % (ref 12.3–15.4)
WBC: 7.1 10*3/uL (ref 3.4–10.8)

## 2016-10-15 LAB — CMP14+EGFR
A/G RATIO: 2.2 (ref 1.2–2.2)
ALBUMIN: 4.3 g/dL (ref 3.6–4.8)
ALT: 13 IU/L (ref 0–32)
AST: 19 IU/L (ref 0–40)
Alkaline Phosphatase: 99 IU/L (ref 39–117)
BILIRUBIN TOTAL: 0.4 mg/dL (ref 0.0–1.2)
BUN/Creatinine Ratio: 12 (ref 12–28)
BUN: 11 mg/dL (ref 8–27)
CALCIUM: 9.8 mg/dL (ref 8.7–10.3)
CO2: 26 mmol/L (ref 20–29)
Chloride: 102 mmol/L (ref 96–106)
Creatinine, Ser: 0.89 mg/dL (ref 0.57–1.00)
GFR calc Af Amer: 79 mL/min/{1.73_m2} (ref >59)
GFR, EST NON AFRICAN AMERICAN: 68 mL/min/{1.73_m2} (ref >59)
GLOBULIN, TOTAL: 2 g/dL (ref 1.5–4.5)
Glucose: 79 mg/dL (ref 65–99)
Potassium: 4.4 mmol/L (ref 3.5–5.2)
Sodium: 142 mmol/L (ref 134–144)
TOTAL PROTEIN: 6.3 g/dL (ref 6.0–8.5)

## 2016-10-15 LAB — TSH: TSH: 3.37 u[IU]/mL (ref 0.450–4.500)

## 2016-10-15 LAB — LIPID PANEL
CHOL/HDL RATIO: 3.4 ratio (ref 0.0–4.4)
CHOLESTEROL TOTAL: 194 mg/dL (ref 100–199)
HDL: 57 mg/dL (ref >39)
LDL CALC: 120 mg/dL — AB (ref 0–99)
TRIGLYCERIDES: 87 mg/dL (ref 0–149)
VLDL CHOLESTEROL CAL: 17 mg/dL (ref 5–40)

## 2017-01-12 ENCOUNTER — Telehealth: Payer: Self-pay | Admitting: Family Medicine

## 2017-01-12 NOTE — Telephone Encounter (Signed)
Patient reports she has been feeling like her blood pressure is elevated.  She has not checked it to see if it is.  She notices when she feels this way that the veins in her forehead are more visible.  Needs to be seen, appointment scheduled for 01/13/2017 at 8:10 am.

## 2017-01-13 ENCOUNTER — Encounter: Payer: Self-pay | Admitting: Family Medicine

## 2017-01-13 ENCOUNTER — Ambulatory Visit: Payer: Medicare Other | Admitting: Family Medicine

## 2017-01-13 VITALS — BP 114/73 | HR 70 | Ht 62.0 in | Wt 127.0 lb

## 2017-01-13 DIAGNOSIS — I10 Essential (primary) hypertension: Secondary | ICD-10-CM

## 2017-01-13 NOTE — Progress Notes (Signed)
BP 114/73   Pulse 70   Ht _0  (1.575 m)   Wt 127 lb (57.6 kg)   BMI 23.23 kg/m    Subjective:    Patient ID: Amanda Mayer, female    DOB: 1950-09-08, 66 y.o.   MRN: [id number]  HPI: Amanda Mayer is a 66 y.o. female presenting on 01/13/2017 for Hypertension   HPI Hypertension Patient is currently on losartan, and their blood pressure today is 114/73. Patient denies any lightheadedness or dizziness. Patient denies headaches, blurred vision, chest pains, shortness of breath, or weakness. Denies any side effects from medication and is content with current medication.   Relevant past medical, surgical, family and social history reviewed and updated as indicated. Interim medical history since our last visit reviewed. Allergies and medications reviewed and updated.  Review of Systems  Constitutional: Negative for chills and fever.  Eyes: Negative for visual disturbance.  Respiratory: Negative for chest tightness and shortness of breath.   Cardiovascular: Negative for chest pain and leg swelling.  Musculoskeletal: Negative for back pain and gait problem.  Skin: Negative for rash.  Neurological: Negative for dizziness, weakness, light-headedness and headaches.  Psychiatric/Behavioral: Negative for agitation, behavioral problems and sleep disturbance.  All other systems reviewed and are negative.   Per HPI unless specifically indicated above        Objective:    BP 114/73   Pulse 70   Ht _1  (1.575 m)   Wt 127 lb (57.6 kg)   BMI 23.23 kg/m   Wt Readings from Last 3 Encounters:  01/13/17 127 lb (57.6 kg)  10/14/16 127 lb (57.6 kg)    Physical Exam  Constitutional: She is oriented to person, place, and time. She appears well-developed and well-nourished. No distress.  Eyes: Conjunctivae are normal.  Neck: Neck supple. No thyromegaly present.  Cardiovascular: Normal rate, regular rhythm, normal heart sounds and intact distal pulses.  No murmur  heard. Pulmonary/Chest: Effort normal and breath sounds normal. No respiratory distress. She has no wheezes.  Musculoskeletal: Normal range of motion. She exhibits no edema or tenderness.  Lymphadenopathy:    She has no cervical adenopathy.  Neurological: She is alert and oriented to person, place, and time. Coordination normal.  Skin: Skin is warm and dry. No rash noted. She is not diaphoretic.  Psychiatric: She has a normal mood and affect. Her behavior is normal.  Nursing note and vitals reviewed.   Results for orders placed or performed in visit on 10/14/16  CMP14+EGFR  Result Value Ref Range   Glucose 79 65 - 99 mg/dL   BUN 11 8 - 27 mg/dL   Creatinine, Ser 0.89 0.57 - 1.00 mg/dL   GFR calc non Af Amer 68 >59 mL/min/1.73   GFR calc Af Amer 79 >59 mL/min/1.73   BUN/Creatinine Ratio 12 12 - 28   Sodium 142 134 - 144 mmol/L   Potassium 4.4 3.5 - 5.2 mmol/L   Chloride 102 96 - 106 mmol/L   CO2 26 20 - 29 mmol/L   Calcium 9.8 8.7 - 10.3 mg/dL   Total Protein 6.3 6.0 - 8.5 g/dL   Albumin 4.3 3.6 - 4.8 g/dL   Globulin, Total 2.0 1.5 - 4.5 g/dL   Albumin/Globulin Ratio 2.2 1.2 - 2.2   Bilirubin Total 0.4 0.0 - 1.2 mg/dL   Alkaline Phosphatase 99 39 - 117 IU/L   AST 19 0 - 40 IU/L   ALT 13 0 - 32 IU/L  Lipid panel  Result Value Ref Range   Cholesterol, Total 194 100 - 199 mg/dL   Triglycerides 87 0 - 149 mg/dL   HDL 57 >39 mg/dL   VLDL Cholesterol Cal 17 5 - 40 mg/dL   LDL Calculated 120 (H) 0 - 99 mg/dL   Chol/HDL Ratio 3.4 0.0 - 4.4 ratio  CBC with Differential/Platelet  Result Value Ref Range   WBC 7.1 3.4 - 10.8 x10E3/uL   RBC 4.56 3.77 - 5.28 x10E6/uL   Hemoglobin 13.3 11.1 - 15.9 g/dL   Hematocrit 38.9 34.0 - 46.6 %   MCV 85 79 - 97 fL   MCH 29.2 26.6 - 33.0 pg   MCHC 34.2 31.5 - 35.7 g/dL   RDW 14.0 12.3 - 15.4 %   Platelets 276 150 - 379 x10E3/uL   Neutrophils 61 Not Estab. %   Lymphs 27 Not Estab. %   Monocytes 9 Not Estab. %   Eos 3 Not Estab. %   Basos 0  Not Estab. %   Neutrophils Absolute 4.3 1.4 - 7.0 x10E3/uL   Lymphocytes Absolute 1.9 0.7 - 3.1 x10E3/uL   Monocytes Absolute 0.7 0.1 - 0.9 x10E3/uL   EOS (ABSOLUTE) 0.2 0.0 - 0.4 x10E3/uL   Basophils Absolute 0.0 0.0 - 0.2 x10E3/uL   Immature Granulocytes 0 Not Estab. %   Immature Grans (Abs) 0.0 0.0 - 0.1 x10E3/uL  TSH  Result Value Ref Range   TSH 3.370 0.450 - 4.500 uIU/mL      Assessment & Plan:   Problem List Items Addressed This Visit      Cardiovascular and Mediastinum   Hypertension - Primary       Follow up plan: Return if symptoms worsen or fail to improve.  Counseling provided for all of the vaccine components No orders of the defined types were placed in this encounter.   Caryl Pina, MD [facility] 01/13/2017, 8:33 AM

## 2017-02-20 ENCOUNTER — Other Ambulatory Visit: Payer: Self-pay

## 2017-02-20 MED ORDER — LOSARTAN POTASSIUM 50 MG PO TABS: 50.0000 mg | ORAL_TABLET | Freq: Every day | ORAL | 1 refills | Status: DC

## 2017-08-29 ENCOUNTER — Other Ambulatory Visit: Payer: Self-pay | Admitting: Family Medicine

## 2017-09-06 ENCOUNTER — Telehealth: Payer: Self-pay | Admitting: Family Medicine

## 2017-09-27 ENCOUNTER — Other Ambulatory Visit: Payer: Self-pay | Admitting: Family Medicine

## 2017-10-02 ENCOUNTER — Encounter: Payer: Self-pay | Admitting: *Deleted

## 2017-10-02 ENCOUNTER — Ambulatory Visit (INDEPENDENT_AMBULATORY_CARE_PROVIDER_SITE_OTHER): Payer: Medicare Other | Admitting: *Deleted

## 2017-10-02 VITALS — BP 129/82 | HR 85 | Ht 62.0 in | Wt 136.8 lb

## 2017-10-02 DIAGNOSIS — Z0000 Encounter for general adult medical examination without abnormal findings: Secondary | ICD-10-CM | POA: Diagnosis not present

## 2017-10-02 MED ORDER — LOSARTAN POTASSIUM 50 MG PO TABS: 50.0000 mg | ORAL_TABLET | Freq: Every day | ORAL | 0 refills | Status: DC

## 2017-10-02 NOTE — Patient Instructions (Addendum)
Amanda Mayer , Thank you for taking time to come for your Medicare Wellness Visit. I appreciate your ongoing commitment to your health goals. Please review the following plan we discussed and let me know if I can assist you in the future.   These are the goals we discussed: Goals    . Exercise 3x per week (30 min per time)     Try to exercise for at least 30 minutes, 3 times weekly       This is a list of the screening recommended for you and due dates:  Health Maintenance  Topic Date Due  .  Hepatitis C: One time screening is recommended by Center for Disease Control  (CDC) for  adults born from [birth year] through 1964.   12-06-1950  . DEXA scan (bone density measurement)  04/14/2016  . Pneumonia vaccines (2 of 2 - PPSV23) 04/14/2016  . Flu Shot  05/22/2018*  . Mammogram  10/03/2018*  . Tetanus Vaccine  01/01/2024  . Colon Cancer Screening  08/05/2027  *Topic was postponed. The date shown is not the original due date.   Advance Directive Advance directives are legal documents that let you make choices ahead of time about your health care and medical treatment in case you become unable to communicate for yourself. Advance directives are a way for you to communicate your wishes to family, friends, and health care providers. This can help convey your decisions about end-of-life care if you become unable to communicate. Discussing and writing advance directives should happen over time rather than all at once. Advance directives can be changed depending on your situation and what you want, even after you have signed the advance directives. If you do not have an advance directive, some states assign family decision makers to act on your behalf based on how closely you are related to them. Each state has its own laws regarding advance directives. You may want to check with your health care provider, attorney, or state representative about the laws in your state. There are different types of advance  directives, such as:  Medical power of attorney.  Living will.  Do not resuscitate (DNR) or do not attempt resuscitation (DNAR) order.  Health care proxy and medical power of attorney A health care proxy, also called a health care agent, is a person who is appointed to make medical decisions for you in cases in which you are unable to make the decisions yourself. Generally, people choose someone they know well and trust to represent their preferences. Make sure to ask this person for an agreement to act as your proxy. A proxy may have to exercise judgment in the event of a medical decision for which your wishes are not known. A medical power of attorney is a legal document that names your health care proxy. Depending on the laws in your state, after the document is written, it may also need to be:  Signed.  Notarized.  Dated.  Copied.  Witnessed.  Incorporated into your medical record.  You may also want to appoint someone to manage your financial affairs in a situation in which you are unable to do so. This is called a durable power of attorney for finances. It is a separate legal document from the durable power of attorney for health care. You may choose the same person or someone different from your health care proxy to act as your agent in financial matters. If you do not appoint a proxy, or if there is a  concern that the proxy is not acting in your best interests, a court-appointed guardian may be designated to act on your behalf. Living will A living will is a set of instructions documenting your wishes about medical care when you cannot express them yourself. Health care providers should keep a copy of your living will in your medical record. You may want to give a copy to family members or friends. To alert caregivers in case of an emergency, you can place a card in your wallet to let them know that you have a living will and where they can find it. A living will is used if you  become:  Terminally ill.  Incapacitated.  Unable to communicate or make decisions.  Items to consider in your living will include:  The use or non-use of life-sustaining equipment, such as dialysis machines and breathing machines (ventilators).  A DNR or DNAR order, which is the instruction not to use cardiopulmonary resuscitation (CPR) if breathing or heartbeat stops.  The use or non-use of tube feeding.  Withholding of food and fluids.  Comfort (palliative) care when the goal becomes comfort rather than a cure.  Organ and tissue donation.  A living will does not give instructions for distributing your money and property if you should pass away. It is recommended that you seek the advice of a lawyer when writing a will. Decisions about taxes, beneficiaries, and asset distribution will be legally binding. This process can relieve your family and friends of any concerns surrounding disputes or questions that may come up about the distribution of your assets. DNR or DNAR A DNR or DNAR order is a request not to have CPR in the event that your heart stops beating or you stop breathing. If a DNR or DNAR order has not been made and shared, a health care provider will try to help any patient whose heart has stopped or who has stopped breathing. If you plan to have surgery, talk with your health care provider about how your DNR or DNAR order will be followed if problems occur. Summary  Advance directives are the legal documents that allow you to make choices ahead of time about your health care and medical treatment in case you become unable to communicate for yourself.  The process of discussing and writing advance directives should happen over time. You can change the advance directives, even after you have signed them.  Advance directives include DNR or DNAR orders, living wills, and designating an agent as your medical power of attorney. This information is not intended to replace advice  given to you by your health care provider. Make sure you discuss any questions you have with your health care provider. Document Released: 04/19/2007 Document Revised: 11/30/2015 Document Reviewed: 11/30/2015 Elsevier Interactive Patient Education  2017 [employer].

## 2017-10-02 NOTE — Progress Notes (Signed)
Subjective:   Amanda Mayer is a 67 y.o. female who presents for an Initial Medicare Annual Wellness Visit.  Amanda Mayer is widowed and currently lives at home with her cat-hobby.  She has 2 brother and 2 sisters that lives close by and visits often.   Amanda Mayer retired from [employer] in 2012.  She enjoys reading, sewing, gardening, decorating and landscaping.  She exercises approximately everyday with gardening and landscaping and eats 3 healthy meals daily.   Amanda Mayer has had no ER visits, hospitalizations or surgeries in the past year.  Overall she feels that her health is the same or better than it was a year ago   Objective:    Today's Vitals   10/02/17 1523  BP: 129/82  Pulse: 85  Weight: 136 lb 12.8 oz (62.1 kg)  Height: 5\' 2"  (1.575 m)   Body mass index is 25.02 kg/m.  No flowsheet data found.  Current Medications (verified) Outpatient Encounter Medications as of 10/02/2017  Medication Sig  . clidinium-chlordiazePOXIDE (LIBRAX) 5-2.5 MG capsule Take 1 capsule by mouth 3 (three) times daily before meals.  . colesevelam (WELCHOL) 625 MG tablet Take 625 mg by mouth daily with breakfast.  . losartan (COZAAR) 50 MG tablet Take 1 tablet (50 mg total) by mouth daily.  . [DISCONTINUED] losartan (COZAAR) 50 MG tablet TAKE 1 TABLET DAILY   No facility-administered encounter medications on file as of 10/02/2017.     Allergies (verified) Codeine and Prednisone   History: Past Medical History:  Diagnosis Date  . Allergy   . Diverticulitis   . Hypertension   . Hypogammaglobulinemia (HCC)   . IgA deficiency (HCC)   . IgG deficiency (HCC)   . IgM deficiency (HCC)   . Irritable bowel    Past Surgical History:  Procedure Laterality Date  . CHOLECYSTECTOMY     Family History  Problem Relation Age of Onset  . Hypertension Mother   . Hypertension Father   . Cancer Father        prostate  . Diabetes Sister   . Alcohol abuse Sister   . COPD Sister     . Miscarriages / Stillbirths Sister   . Diabetes Brother   . Alcohol abuse Brother   . Hypertension Brother   . Hearing loss Maternal Grandfather   . Stroke Paternal Grandmother   . Early death Paternal Grandfather   . Alcohol abuse Brother   . COPD Brother   . Diabetes Brother   . Diabetes Daughter    Social History   Socioeconomic History  . Marital status: Widowed    Spouse name: Not on file  . Number of children: Not on file  . Years of education: Masters  . Highest education level: Master's degree (e.g., MA, MS, MEng, MEd, MSW, MBA)  Occupational History  . Occupation: Retired  Engineer, production  . Financial resource strain: Not hard at all  . Food insecurity:    Worry: Never true    Inability: Never true  . Transportation needs:    Medical: No    Non-medical: No  Tobacco Use  . Smoking status: Never Smoker  . Smokeless tobacco: Never Used  Substance and Sexual Activity  . Alcohol use: No  . Drug use: No  . Sexual activity: Not Currently  Lifestyle  . Physical activity:    Days per week: 7 days    Minutes per session: 60 min  . Stress: Not at all  Relationships  .  Social connections:    Talks on phone: More than three times a week    Gets together: More than three times a week    Attends religious service: More than 4 times per year    Active member of club or organization: No    Attends meetings of clubs or organizations: Never    Relationship status: Widowed  Other Topics Concern  . Not on file  Social History Narrative  . Not on file    Tobacco Counseling No tobacco use  Clinical Intake:  Pre-visit preparation completed: Yes  Pain : No/denies pain     BMI - recorded: 23.2 Nutritional Status: BMI of 19-24  Normal Nutritional Risks: None Diabetes: No  How often do you need to have someone help you when you read instructions, pamphlets, or other written materials from your doctor or pharmacy?: 1 - Never What is the last grade level you  completed in school?: Masters degree  Interpreter Needed?: No  Information entered by :: Josetta Huddle, LPN   Activities of Daily Living In your present state of health, do you have any difficulty performing the following activities: 10/02/2017  Hearing? N  Vision? N  Difficulty concentrating or making decisions? N  Walking or climbing stairs? N  Dressing or bathing? N  Doing errands, shopping? N  Some recent data might be hidden  No difficulties with ADLs at this time  Immunizations and Health Maintenance Immunization History  Administered Date(s) Administered  . Pneumococcal Conjugate-13 05/09/2012  . Tdap 12/31/2013  . Zoster 11/21/2013   Health Maintenance Due  Topic Date Due  . Hepatitis C Screening  1950-12-28  . DEXA SCAN  04/14/2016  . PNA vac Low Risk Adult (2 of 2 - PPSV23) 04/14/2016  Declines pneumovax and dexa scan today.  To be done at next office visit  Patient Care Team: Dettinger, Elige Radon, MD as PCP - General (Family Medicine) Vida Rigger, MD as Consulting Physician (Gastroenterology)  Indicate any recent Medical Services you may have received from other than [facility] providers in the past year (date may be approximate).     Assessment:   This is a routine wellness examination for [place].  Hearing/Vision screen No exam data present  Dietary issues and exercise activities discussed: Current Exercise Habits: Home exercise routine, Time (Minutes): 60, Frequency (Times/Week): 7, Weekly Exercise (Minutes/Week): 420, Intensity: Mild  Goals    . Exercise 3x per week (30 min per time)     Try to exercise for at least 30 minutes, 3 times weekly      Depression Screen PHQ 2/9 Scores 10/02/2017 01/13/2017 10/14/2016  PHQ - 2 Score 0 0 0    Fall Risk Fall Risk  10/02/2017 01/13/2017 10/14/2016  Falls in the past year? No No No    Is the patient's home free of loose throw rugs in walkways, pet beds, electrical cords, etc?   yes      Grab bars in the bathroom?  yes      Handrails on the stairs?   yes      Adequate lighting?   yes  Timed Get Up and Go Performed   Cognitive Function: MMSE - Mini Mental State Exam 10/02/2017  Orientation to time 5  Orientation to Place 5  Registration 3  Attention/ Calculation 5  Recall 3  Language- name 2 objects 2  Language- repeat 1  Language- follow 3 step command 3  Language- read & follow direction 1  Write a sentence 1  Copy design 1  Total score 30    NO memory loss noted at this time    Screening Tests Health Maintenance  Topic Date Due  . Hepatitis C Screening  [birth year]  . DEXA SCAN  04/14/2016  . PNA vac Low Risk Adult (2 of 2 - PPSV23) 04/14/2016  . INFLUENZA VACCINE  05/22/2018 (Originally 08/24/2017)  . MAMMOGRAM  10/03/2018 (Originally 10/12/2007)  . TETANUS/TDAP  01/01/2024  . COLONOSCOPY  08/05/2027  Will discuss Mammogram at office visit with Dr. Louanne Skye Would like to wait on pneumovax and flu vaccine until office visit with Dr. Louanne Skye  Qualifies for Shingles Vaccine? Declined Cancer Screenings: Lung: Low Dose CT Chest recommended if Age 59-80 years, 30 pack-year currently smoking OR have quit w/in 15years. Patient does not qualify. Breast: Up to date on Mammogram? No   Up to date of Bone Density/Dexa? No  Colorectal: Yes  Additional Screenings:  Hepatitis C Screening:      Plan:   Encouraged to continue to eat 3 healthy meals daily that consist of lean proteins, fruits and vegetables.  Encouraged to try to exercise-walk for at least 30 minutes, 3 times weekly.  Encouraged to look over advance directive information.  Hand out given.  Explained the importance of pneumovax, shingrix, flu vaccine and mammogram, will discuss with PCP at next office visit.  Encouraged to keep follow up/physical appointment with Dr. Louanne Skye on 10/23/17.  I have personally reviewed and noted the following in the patient's chart:   . Medical and social history . Use of alcohol, tobacco or  illicit drugs  . Current medications and supplements . Functional ability and status . Nutritional status . Physical activity . Advanced directives . List of other physicians . Hospitalizations, surgeries, and ER visits in previous 12 months . Vitals . Screenings to include cognitive, depression, and falls . Referrals and appointments  In addition, I have reviewed and discussed with patient certain preventive protocols, quality metrics, and best practice recommendations. A written personalized care plan for preventive services as well as general preventive health recommendations were provided to patient.     Caryl Bis, LPN   04/27/9673

## 2017-10-23 ENCOUNTER — Ambulatory Visit (INDEPENDENT_AMBULATORY_CARE_PROVIDER_SITE_OTHER): Payer: Medicare Other | Admitting: Family Medicine

## 2017-10-23 ENCOUNTER — Encounter: Payer: Self-pay | Admitting: Family Medicine

## 2017-10-23 VITALS — BP 126/91 | HR 77 | Temp 97.0°F | Ht 62.0 in | Wt 138.2 lb

## 2017-10-23 DIAGNOSIS — I10 Essential (primary) hypertension: Secondary | ICD-10-CM | POA: Diagnosis not present

## 2017-10-23 DIAGNOSIS — Z131 Encounter for screening for diabetes mellitus: Secondary | ICD-10-CM

## 2017-10-23 DIAGNOSIS — K580 Irritable bowel syndrome with diarrhea: Secondary | ICD-10-CM

## 2017-10-23 DIAGNOSIS — E663 Overweight: Secondary | ICD-10-CM | POA: Diagnosis not present

## 2017-10-23 DIAGNOSIS — Z23 Encounter for immunization: Secondary | ICD-10-CM | POA: Diagnosis not present

## 2017-10-23 MED ORDER — AMLODIPINE BESYLATE 5 MG PO TABS: 5.0000 mg | ORAL_TABLET | Freq: Every day | ORAL | 3 refills | Status: DC

## 2017-10-23 MED ORDER — ELUXADOLINE 75 MG PO TABS: 75.0000 mg | ORAL_TABLET | Freq: Two times a day (BID) | ORAL | 0 refills | Status: DC

## 2017-10-23 NOTE — Addendum Note (Signed)
Addended by: Lorelee Cover C on: 10/23/2017 03:16 PM   Modules accepted: Orders

## 2017-10-23 NOTE — Progress Notes (Signed)
BP (!) 126/91   Pulse 77   Temp (!) 97 F (36.1 C) (Oral)   Ht '5\' 2"'  (1.575 m)   Wt 138 lb 3.2 oz (62.7 kg)   BMI 25.28 kg/m    Subjective:    Patient ID: Amanda Mayer, female    DOB: 1950/05/27, 67 y.o.   MRN: [id number]  HPI: Amanda Mayer is a 67 y.o. female presenting on 10/23/2017 for Annual Exam   HPI Hypertension Patient is currently on losartan, patient has qualms about losartan because her brother may have developed renal failure because of it and she would like to switch and she is also heard about a lot of the recalls from it and would like to switch, and their blood pressure today is 126/91. Patient denies any lightheadedness or dizziness. Patient denies headaches, blurred vision, chest pains, shortness of breath, or weakness. Denies any side effects from medication and is content with current medication.   IBS with diarrhea Patient has diarrhea or bowel movement that is loose after every meal every time that she eats and has been dealing with this for some time and has tried multiple medications including Restasis and WelChol and clidinium-chlordiazepoxide without much success she is never tried Social worker.  She would like to try something new if it could possibly help her  Relevant past medical, surgical, family and social history reviewed and updated as indicated. Interim medical history since our last visit reviewed. Allergies and medications reviewed and updated.  Review of Systems  Constitutional: Negative for chills and fever.  Eyes: Negative for visual disturbance.  Respiratory: Negative for chest tightness and shortness of breath.   Cardiovascular: Negative for chest pain and leg swelling.  Gastrointestinal: Positive for diarrhea. Negative for abdominal distention, abdominal pain, constipation, nausea and vomiting.  Musculoskeletal: Negative for back pain and gait problem.  Skin: Negative for rash.  Neurological: Negative for dizziness, light-headedness  and headaches.  Psychiatric/Behavioral: Negative for agitation and behavioral problems.  All other systems reviewed and are negative.   Per HPI unless specifically indicated above   Allergies as of 10/23/2017      Reactions   Codeine Other (See Comments), Rash   unknown hallucinations   Prednisone Other (See Comments), Rash   Swelling and joint pain      Medication List        Accurate as of 10/23/17  2:33 PM. Always use your most recent med list.          clidinium-chlordiazePOXIDE 5-2.5 MG capsule Commonly known as:  LIBRAX Take 1 capsule by mouth 3 (three) times daily before meals.   colesevelam 625 MG tablet Commonly known as:  WELCHOL Take 625 mg by mouth daily with breakfast.   losartan 50 MG tablet Commonly known as:  COZAAR Take 1 tablet (50 mg total) by mouth daily.          Objective:    BP (!) 126/91   Pulse 77   Temp (!) 97 F (36.1 C) (Oral)   Ht '5\' 2"'  (1.575 m)   Wt 138 lb 3.2 oz (62.7 kg)   BMI 25.28 kg/m   Wt Readings from Last 3 Encounters:  10/23/17 138 lb 3.2 oz (62.7 kg)  10/02/17 136 lb 12.8 oz (62.1 kg)  01/13/17 127 lb (57.6 kg)    Physical Exam  Constitutional: She is oriented to person, place, and time. She appears well-developed and well-nourished. No distress.  Eyes: Conjunctivae are normal.  Neck: Neck supple. No thyromegaly  present.  Cardiovascular: Normal rate, regular rhythm, normal heart sounds and intact distal pulses.  No murmur heard. Pulmonary/Chest: Effort normal and breath sounds normal. No respiratory distress. She has no wheezes.  Abdominal: Soft. Bowel sounds are normal. She exhibits no distension and no mass. There is no tenderness. There is no guarding.  Lymphadenopathy:    She has no cervical adenopathy.  Neurological: She is alert and oriented to person, place, and time. Coordination normal.  Skin: Skin is warm and dry. No rash noted. She is not diaphoretic.  Psychiatric: She has a normal mood and affect.  Her behavior is normal.  Nursing note and vitals reviewed.       Assessment & Plan:   Problem List Items Addressed This Visit      Cardiovascular and Mediastinum   Hypertension - Primary   Relevant Medications   amLODipine (NORVASC) 5 MG tablet   Other Relevant Orders   CMP14+EGFR     Digestive   Irritable bowel syndrome   Relevant Medications   Eluxadoline (VIBERZI) 75 MG TABS   Other Relevant Orders   CBC with Differential/Platelet     Other   Overweight (BMI 25.0-29.9)   Relevant Orders   Lipid panel    Other Visit Diagnoses    Diabetes mellitus screening       Relevant Orders   CMP14+EGFR      Switched from losartan to amlodipine because of patient's desire, will try and start Viberzi for IBS and check labs today. Follow up plan: Return in about 4 weeks (around 11/20/2017), or if symptoms worsen or fail to improve, for Hypertension and IBS recheck.  Counseling provided for all of the vaccine components Orders Placed This Encounter  Procedures  . CBC with Differential/Platelet  . CMP14+EGFR  . Lipid panel    Caryl Pina, MD [facility] 10/23/2017, 2:33 PM

## 2017-10-24 LAB — CMP14+EGFR
A/G RATIO: 2.6 — AB (ref 1.2–2.2)
ALK PHOS: 99 IU/L (ref 39–117)
ALT: 14 IU/L (ref 0–32)
AST: 16 IU/L (ref 0–40)
Albumin: 4.7 g/dL (ref 3.6–4.8)
BILIRUBIN TOTAL: 0.3 mg/dL (ref 0.0–1.2)
BUN/Creatinine Ratio: 17 (ref 12–28)
BUN: 16 mg/dL (ref 8–27)
CO2: 25 mmol/L (ref 20–29)
Calcium: 9.9 mg/dL (ref 8.7–10.3)
Chloride: 106 mmol/L (ref 96–106)
Creatinine, Ser: 0.94 mg/dL (ref 0.57–1.00)
GFR calc non Af Amer: 63 mL/min/{1.73_m2} (ref >59)
GFR, EST AFRICAN AMERICAN: 73 mL/min/{1.73_m2} (ref >59)
GLUCOSE: 88 mg/dL (ref 65–99)
Globulin, Total: 1.8 g/dL (ref 1.5–4.5)
Potassium: 5.3 mmol/L — ABNORMAL HIGH (ref 3.5–5.2)
SODIUM: 144 mmol/L (ref 134–144)
TOTAL PROTEIN: 6.5 g/dL (ref 6.0–8.5)

## 2017-10-24 LAB — CBC WITH DIFFERENTIAL/PLATELET
BASOS ABS: 0.1 10*3/uL (ref 0.0–0.2)
BASOS: 1 %
EOS (ABSOLUTE): 0.3 10*3/uL (ref 0.0–0.4)
Eos: 4 %
Hematocrit: 40.4 % (ref 34.0–46.6)
Hemoglobin: 14 g/dL (ref 11.1–15.9)
IMMATURE GRANS (ABS): 0.1 10*3/uL (ref 0.0–0.1)
Immature Granulocytes: 1 %
LYMPHS: 31 %
Lymphocytes Absolute: 2.4 10*3/uL (ref 0.7–3.1)
MCH: 30 pg (ref 26.6–33.0)
MCHC: 34.7 g/dL (ref 31.5–35.7)
MCV: 87 fL (ref 79–97)
Monocytes Absolute: 0.8 10*3/uL (ref 0.1–0.9)
Monocytes: 10 %
NEUTROS ABS: 4.2 10*3/uL (ref 1.4–7.0)
Neutrophils: 53 %
PLATELETS: 289 10*3/uL (ref 150–450)
RBC: 4.67 x10E6/uL (ref 3.77–5.28)
RDW: 13 % (ref 12.3–15.4)
WBC: 7.7 10*3/uL (ref 3.4–10.8)

## 2017-10-24 LAB — LIPID PANEL
CHOL/HDL RATIO: 3.4 ratio (ref 0.0–4.4)
Cholesterol, Total: 209 mg/dL — ABNORMAL HIGH (ref 100–199)
HDL: 62 mg/dL (ref >39)
LDL CALC: 131 mg/dL — AB (ref 0–99)
TRIGLYCERIDES: 81 mg/dL (ref 0–149)
VLDL Cholesterol Cal: 16 mg/dL (ref 5–40)

## 2017-11-24 ENCOUNTER — Ambulatory Visit: Payer: Medicare Other | Admitting: Family Medicine

## 2017-11-24 ENCOUNTER — Encounter: Payer: Self-pay | Admitting: Family Medicine

## 2017-11-24 VITALS — BP 133/73 | HR 73 | Temp 97.0°F | Ht 62.0 in | Wt 137.4 lb

## 2017-11-24 DIAGNOSIS — K580 Irritable bowel syndrome with diarrhea: Secondary | ICD-10-CM

## 2017-11-24 DIAGNOSIS — I10 Essential (primary) hypertension: Secondary | ICD-10-CM | POA: Diagnosis not present

## 2017-11-24 DIAGNOSIS — Z23 Encounter for immunization: Secondary | ICD-10-CM

## 2017-11-24 NOTE — Progress Notes (Signed)
BP 133/73   Pulse 73   Temp (!) 97 F (36.1 C) (Oral)   Ht '5\' 2"'  (1.575 m)   Wt 62.3 kg   BMI 25.13 kg/m    Subjective:    Patient ID: Amanda Mayer, female    DOB: 1950-11-13, 67 y.o.   MRN: [id number]  HPI: RENAE Mayer is a 67 y.o. female presenting on 11/24/2017 for Hypertension (4 week follow up) and Irritable Bowel Syndrome   Hypertension At the last office visit the patient was changed from Losartan to Amlodipine due to recall concerns. Patient started the amlodipine and after 1 week she noticed swelling in her extremities. In the morning the swelling is mild but slowly increases over the course of the day. Patient is unable to take off her rings. She is hoping to try another medication. BP today is 133/73. Patient has a BP cuff at home, but has not been checking it She denies headache, lightheadedness, dizziness, fever, chest pain, or sob.   IBS-D Patient was given samples of Viberzi, took 3 doses, and then read on the lable patients without a gallbladder shouldn't take the medicine due to increased risk of pancreatitis. She spoke to a family member, a NP, who he said if she had no abdominal pain after 3 doses it is most likely safe to continue, but she decided to discontinue the medicatoin. She has been taking Clinidium which has improved helped her diarrhea symptoms. Patient denies changes in consistency or bloody diarrhea.  Relevant past medical, surgical, family and social history reviewed and updated as indicated. Interim medical history since our last visit reviewed. Allergies and medications reviewed and updated.   Review of Systems  Constitutional: Negative for chills, fatigue and fever.  HENT: Negative.   Respiratory: Negative for shortness of breath.   Cardiovascular: Positive for leg swelling. Negative for chest pain.  Gastrointestinal: Positive for abdominal pain and diarrhea. Negative for blood in stool and nausea.  Musculoskeletal: Positive for myalgias.  Negative for arthralgias.  Skin: Negative.   Neurological: Negative for dizziness, light-headedness and headaches.    Per HPI unless specifically indicated above   Allergies as of 11/24/2017      Reactions   Codeine Other (See Comments), Rash   unknown hallucinations   Prednisone Other (See Comments), Rash   Swelling and joint pain      Medication List        Accurate as of 11/24/17  9:35 AM. Always use your most recent med list.          amLODipine 5 MG tablet Commonly known as:  NORVASC Take 1 tablet (5 mg total) by mouth daily.   clidinium-chlordiazePOXIDE 5-2.5 MG capsule Commonly known as:  LIBRAX Take 1 capsule by mouth 3 (three) times daily before meals.   colesevelam 625 MG tablet Commonly known as:  WELCHOL Take 625 mg by mouth daily with breakfast.          Objective:    BP 133/73   Pulse 73   Temp (!) 97 F (36.1 C) (Oral)   Ht '5\' 2"'  (1.575 m)   Wt 62.3 kg   BMI 25.13 kg/m   Wt Readings from Last 3 Encounters:  11/24/17 62.3 kg  10/23/17 62.7 kg  10/02/17 62.1 kg    Physical Exam  Constitutional: She appears well-developed. No distress.  HENT:  Head: Normocephalic and atraumatic.  Eyes: Conjunctivae are normal.  Neck: Neck supple.  Cardiovascular: Normal rate, regular rhythm and normal heart  sounds. Exam reveals no gallop and no friction rub.  No murmur heard. Pulmonary/Chest: Effort normal and breath sounds normal.  Abdominal: Soft. Bowel sounds are normal. There is tenderness.  Musculoskeletal:  Trace edema extremities  Neurological: She is alert.  Skin: Skin is warm.  Vitals reviewed.   Results for orders placed or performed in visit on 10/23/17  CBC with Differential/Platelet  Result Value Ref Range   WBC 7.7 3.4 - 10.8 x10E3/uL   RBC 4.67 3.77 - 5.28 x10E6/uL   Hemoglobin 14.0 11.1 - 15.9 g/dL   Hematocrit 40.4 34.0 - 46.6 %   MCV 87 79 - 97 fL   MCH 30.0 26.6 - 33.0 pg   MCHC 34.7 31.5 - 35.7 g/dL   RDW 13.0 12.3 - 15.4  %   Platelets 289 150 - 450 x10E3/uL   Neutrophils 53 Not Estab. %   Lymphs 31 Not Estab. %   Monocytes 10 Not Estab. %   Eos 4 Not Estab. %   Basos 1 Not Estab. %   Neutrophils Absolute 4.2 1.4 - 7.0 x10E3/uL   Lymphocytes Absolute 2.4 0.7 - 3.1 x10E3/uL   Monocytes Absolute 0.8 0.1 - 0.9 x10E3/uL   EOS (ABSOLUTE) 0.3 0.0 - 0.4 x10E3/uL   Basophils Absolute 0.1 0.0 - 0.2 x10E3/uL   Immature Granulocytes 1 Not Estab. %   Immature Grans (Abs) 0.1 0.0 - 0.1 x10E3/uL  CMP14+EGFR  Result Value Ref Range   Glucose 88 65 - 99 mg/dL   BUN 16 8 - 27 mg/dL   Creatinine, Ser 0.94 0.57 - 1.00 mg/dL   GFR calc non Af Amer 63 >59 mL/min/1.73   GFR calc Af Amer 73 >59 mL/min/1.73   BUN/Creatinine Ratio 17 12 - 28   Sodium 144 134 - 144 mmol/L   Potassium 5.3 (H) 3.5 - 5.2 mmol/L   Chloride 106 96 - 106 mmol/L   CO2 25 20 - 29 mmol/L   Calcium 9.9 8.7 - 10.3 mg/dL   Total Protein 6.5 6.0 - 8.5 g/dL   Albumin 4.7 3.6 - 4.8 g/dL   Globulin, Total 1.8 1.5 - 4.5 g/dL   Albumin/Globulin Ratio 2.6 (H) 1.2 - 2.2   Bilirubin Total 0.3 0.0 - 1.2 mg/dL   Alkaline Phosphatase 99 39 - 117 IU/L   AST 16 0 - 40 IU/L   ALT 14 0 - 32 IU/L  Lipid panel  Result Value Ref Range   Cholesterol, Total 209 (H) 100 - 199 mg/dL   Triglycerides 81 0 - 149 mg/dL   HDL 62 >39 mg/dL   VLDL Cholesterol Cal 16 5 - 40 mg/dL   LDL Calculated 131 (H) 0 - 99 mg/dL   Chol/HDL Ratio 3.4 0.0 - 4.4 ratio      Assessment & Plan:   Problem List Items Addressed This Visit      Cardiovascular and Mediastinum   Hypertension - Primary     Digestive   Irritable bowel syndrome    Other Visit Diagnoses    Encounter for immunization       Relevant Orders   Flu vaccine HIGH DOSE PF (Completed)     HTN Patient recently started Amlodipine and BP today was 133/73. The patient has not been checking BPs at home. After 1 week of taking amlodipine she noticed extremity swelling and would like to try something different. After  discussing other options with patient she would like to try lifestyle changes and exercising more. I recommend her take her  BP at home every couple days and call with the numbers in 2 weeks. BP goal is <140/90. She denies fever, chills, chest pain, or sob.   IBS-D Patient was given Viberzi samples from GI doctor but only took 3 doses because she read it can cause pancreatitis in patients without a gallbladder. She denies any abdominal pain, but still decided to discontinue the medicaitons. She has been taking Librax which helps diarrhea symptoms.  She denies any changes in symptoms.    Follow up plan: Return in about 3 months (around 02/24/2018), or if symptoms worsen or fail to improve, for Hypertension recheck.  Counseling provided for all of the vaccine components No orders of the defined types were placed in this encounter.  Patient seen and examined with Cadence Kathlen Mody PA student, agree with assessment and plan above.  We will continue to redirect her GI and to diet lifestyle changes for blood pressure for now Caryl Pina, MD [facility] 11/24/2017, 9:35 AM

## 2017-12-14 ENCOUNTER — Telehealth: Payer: Self-pay | Admitting: Family Medicine

## 2017-12-15 NOTE — Telephone Encounter (Signed)
lmtcb

## 2017-12-19 ENCOUNTER — Telehealth: Payer: Self-pay

## 2017-12-19 NOTE — Telephone Encounter (Signed)
lmtcb with bp results.   Multiple attempts have been made to contact patient.  This encounter will now be closed

## 2017-12-19 NOTE — Telephone Encounter (Signed)
Patient states that Dr. Louanne Skyeettinger told patient to call in with her BP readings.  Readings below are with no medication.  11/1- 131/86 11/3- 112/77 11/5- 143/82 11/7- 154/105 11/9- 164/94 11/11- 163/90 11/13- 148/99 11/15- 156/93  11/18- 174/89     Patient states that she started back her Losartan back on the 11/18 since her bp was going up.  States since then she has not taken her bp and also is only having minimal swelling.  Please review and advise - patient aware you will be back in the office 11/27.

## 2017-12-20 ENCOUNTER — Other Ambulatory Visit: Payer: Self-pay | Admitting: *Deleted

## 2017-12-20 MED ORDER — LOSARTAN POTASSIUM 50 MG PO TABS: 50.0000 mg | ORAL_TABLET | Freq: Every day | ORAL | 1 refills | Status: DC

## 2017-12-20 NOTE — Telephone Encounter (Signed)
Aware, can restart medication.   [facility].

## 2017-12-20 NOTE — Telephone Encounter (Signed)
Okay thanks for the blood pressure readings and let her know that I agree with her restarting the medication.

## 2017-12-22 ENCOUNTER — Other Ambulatory Visit: Payer: Self-pay

## 2017-12-22 NOTE — Patient Outreach (Signed)
[facility] Care Management  12/22/2017  Baldemar Lenismelia M Catalfamo 1950/08/30 [id number]   Medication Adherence call to Mrs. Sammuel Coopermelia Nie left a message for patient to call back patient is due on Losartan 50 mg.Mrs. Trinda PascalMcnairy is showing past due under [employer].   Lillia AbedAna Ollison-Moran CPhT Pharmacy Technician [facility] Care Management Direct Dial [phone]  Fax [phone] [email or web address]

## 2018-03-20 ENCOUNTER — Ambulatory Visit (INDEPENDENT_AMBULATORY_CARE_PROVIDER_SITE_OTHER): Payer: Medicare Other | Admitting: *Deleted

## 2018-03-20 DIAGNOSIS — Z23 Encounter for immunization: Secondary | ICD-10-CM

## 2018-03-20 LAB — HM MAMMOGRAPHY

## 2018-03-20 NOTE — Progress Notes (Signed)
Pt given shingrix vaccine Tolerated well 

## 2018-03-20 NOTE — Patient Instructions (Signed)
Zoster Vaccine, Recombinant injection  What is this medicine?  ZOSTER VACCINE (ZOS ter vak SEEN) is used to prevent shingles in adults 67 years old and over. This vaccine is not used to treat shingles or nerve pain from shingles.  This medicine may be used for other purposes; ask your health care provider or pharmacist if you have questions.  COMMON BRAND NAME(S): SHINGRIX  What should I tell my health care provider before I take this medicine?  They need to know if you have any of these conditions:  -blood disorders or disease  -cancer like leukemia or lymphoma  -immune system problems or therapy  -an unusual or allergic reaction to vaccines, other medications, foods, dyes, or preservatives  -pregnant or trying to get pregnant  -breast-feeding  How should I use this medicine?  This vaccine is for injection in a muscle. It is given by a health care professional.  Talk to your pediatrician regarding the use of this medicine in children. This medicine is not approved for use in children.  Overdosage: If you think you have taken too much of this medicine contact a poison control center or emergency room at once.  NOTE: This medicine is only for you. Do not share this medicine with others.  What if I miss a dose?  Keep appointments for follow-up (booster) doses as directed. It is important not to miss your dose. Call your doctor or health care professional if you are unable to keep an appointment.  What may interact with this medicine?  -medicines that suppress your immune system  -medicines to treat cancer  -steroid medicines like prednisone or cortisone  This list may not describe all possible interactions. Give your health care provider a list of all the medicines, herbs, non-prescription drugs, or dietary supplements you use. Also tell them if you smoke, drink alcohol, or use illegal drugs. Some items may interact with your medicine.  What should I watch for while using this medicine?  Visit your doctor for regular  check ups.  This vaccine, like all vaccines, may not fully protect everyone.  What side effects may I notice from receiving this medicine?  Side effects that you should report to your doctor or health care professional as soon as possible:  -allergic reactions like skin rash, itching or hives, swelling of the face, lips, or tongue  -breathing problems  Side effects that usually do not require medical attention (report these to your doctor or health care professional if they continue or are bothersome):  -chills  -headache  -fever  -nausea, vomiting  -redness, warmth, pain, swelling or itching at site where injected  -tiredness  This list may not describe all possible side effects. Call your doctor for medical advice about side effects. You may report side effects to FDA at 1-800-FDA-1088.  Where should I keep my medicine?  This vaccine is only given in a clinic, pharmacy, doctor's office, or other health care setting and will not be stored at home.  NOTE: This sheet is a summary. It may not cover all possible information. If you have questions about this medicine, talk to your doctor, pharmacist, or health care provider.   2019 Elsevier/Gold Standard (2016-08-22 13:20:30)

## 2018-03-26 ENCOUNTER — Telehealth: Payer: Self-pay | Admitting: Family Medicine

## 2018-03-26 NOTE — Telephone Encounter (Signed)
Patients questions answered

## 2018-03-29 ENCOUNTER — Ambulatory Visit (INDEPENDENT_AMBULATORY_CARE_PROVIDER_SITE_OTHER): Payer: Medicare Other

## 2018-03-29 ENCOUNTER — Ambulatory Visit: Payer: Medicare Other | Admitting: Podiatry

## 2018-03-29 ENCOUNTER — Encounter: Payer: Self-pay | Admitting: Podiatry

## 2018-03-29 DIAGNOSIS — M2021 Hallux rigidus, right foot: Secondary | ICD-10-CM

## 2018-03-29 DIAGNOSIS — M2041 Other hammer toe(s) (acquired), right foot: Secondary | ICD-10-CM

## 2018-03-29 DIAGNOSIS — M2042 Other hammer toe(s) (acquired), left foot: Secondary | ICD-10-CM

## 2018-03-29 DIAGNOSIS — M2022 Hallux rigidus, left foot: Secondary | ICD-10-CM | POA: Diagnosis not present

## 2018-03-29 NOTE — Progress Notes (Signed)
  Subjective:  Patient ID: Amanda Mayer, female    DOB: October 14, 1950,  MRN: [id number] HPI Chief Complaint  Patient presents with  . Toe Pain    2nd toes bilateral - concerned about toes overlapping big toes (R>L)  . Toe Injury    3rd toe right - dropped 2 liter drink bottle on toe  . New Patient (Initial Visit)    68 y.o. female presents with the above complaint.   ROS: Denies fever chills nausea vomiting muscle aches pains calf pain back pain chest pain shortness of breath.  Past Medical History:  Diagnosis Date  . Allergy   . Diverticulitis   . Hypertension   . Hypogammaglobulinemia (HCC)   . IgA deficiency (HCC)   . IgG deficiency (HCC)   . IgM deficiency (HCC)   . Irritable bowel    Past Surgical History:  Procedure Laterality Date  . CHOLECYSTECTOMY    . COLONOSCOPY      Current Outpatient Medications:  .  clidinium-chlordiazePOXIDE (LIBRAX) 5-2.5 MG capsule, Take 1 capsule by mouth 3 (three) times daily before meals., Disp: , Rfl:  .  colesevelam (WELCHOL) 625 MG tablet, Take 625 mg by mouth daily with breakfast., Disp: , Rfl:  .  losartan (COZAAR) 50 MG tablet, Take 1 tablet (50 mg total) by mouth daily., Disp: 90 tablet, Rfl: 1 .  RESTASIS 0.05 % ophthalmic emulsion, , Disp: , Rfl:   Allergies  Allergen Reactions  . Codeine Other (See Comments) and Rash    unknown hallucinations  . Prednisone Other (See Comments) and Rash    Swelling and joint pain    Review of Systems Objective:  There were no vitals filed for this visit.  General: Well developed, nourished, in no acute distress, alert and oriented x3   Dermatological: Skin is warm, dry and supple bilateral. Nails x 10 are well maintained; remaining integument appears unremarkable at this time. There are no open sores, no preulcerative lesions, no rash or signs of infection present.  Vascular: Dorsalis Pedis artery and Posterior Tibial artery pedal pulses are 2/4 bilateral with immedate capillary  fill time. Pedal hair growth present. No varicosities and no lower extremity edema present bilateral.   Neruologic: Grossly intact via light touch bilateral. Vibratory intact via tuning fork bilateral. Protective threshold with Semmes Wienstein monofilament intact to all pedal sites bilateral. Patellar and Achilles deep tendon reflexes 2+ bilateral. No Babinski or clonus noted bilateral.   Musculoskeletal: No gross boney pedal deformities bilateral. No pain, crepitus, or limitation noted with foot and ankle range of motion bilateral. Muscular strength 5/5 in all groups tested bilateral.  Pain on attempted range of motion of the first metatarsophalangeal joint.  Gait: Unassisted, Nonantalgic.    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  Otherwise no other acute findings.  Assessment & Plan:   Assessment: Hallux rigidus first metatarsophalangeal joint bilateral.  Plan: Discussed the need for surgical intervention.  Patient declined to discuss surgery I expressed to her that there is really nothing else to do other than surgery to provide motion to these toes and to keep them in a rectus position she understands that is amenable to it.     Farrell Pantaleo T. [place], [place]

## 2018-04-20 ENCOUNTER — Other Ambulatory Visit: Payer: Self-pay | Admitting: Family Medicine

## 2018-07-09 ENCOUNTER — Other Ambulatory Visit: Payer: Self-pay | Admitting: Family Medicine

## 2018-07-25 ENCOUNTER — Telehealth: Payer: Self-pay | Admitting: Family Medicine

## 2018-07-25 NOTE — Telephone Encounter (Signed)
Pt is having some issues with IBS  requesting RX televisit scheduled

## 2018-07-26 ENCOUNTER — Ambulatory Visit (INDEPENDENT_AMBULATORY_CARE_PROVIDER_SITE_OTHER): Payer: Medicare Other | Admitting: Family Medicine

## 2018-07-26 ENCOUNTER — Encounter: Payer: Self-pay | Admitting: Family Medicine

## 2018-07-26 ENCOUNTER — Other Ambulatory Visit: Payer: Self-pay

## 2018-07-26 DIAGNOSIS — K580 Irritable bowel syndrome with diarrhea: Secondary | ICD-10-CM

## 2018-07-26 MED ORDER — WELCHOL 625 MG PO TABS: 625.0000 mg | ORAL_TABLET | Freq: Two times a day (BID) | ORAL | 3 refills | Status: DC | PRN

## 2018-07-26 NOTE — Progress Notes (Signed)
Virtual Visit via telephone Note  I connected with Amanda Mayer on 07/26/18 at 1331 by telephone and verified that I am speaking with the correct person using two identifiers. Amanda Lenismelia M Juba is currently located at home and no other people are currently with her during visit. The provider, Elige RadonJoshua A Story Vanvranken, MD is located in their office at time of visit.  Call ended at 1341  I discussed the limitations, risks, security and privacy concerns of performing an evaluation and management service by telephone and the availability of in person appointments. I also discussed with the patient that there may be a patient responsible charge related to this service. The patient expressed understanding and agreed to proceed.   History and Present Illness: Patient is calling in with diarrhea and ibs that is not currently been working with the generic welchol and felt like the brand is not working.  She feels like she cannot get outside without having to rush home.  She denies any blood in her stool. She denies any fevers or chills.  She has had to stop on the side of the road while driving.   No diagnosis found.  Outpatient Encounter Medications as of 07/26/2018  Medication Sig  . clidinium-chlordiazePOXIDE (LIBRAX) 5-2.5 MG capsule Take 1 capsule by mouth 3 (three) times daily before meals.  . colesevelam (WELCHOL) 625 MG tablet Take 625 mg by mouth daily with breakfast.  . losartan (COZAAR) 50 MG tablet Take 1 tablet (50 mg total) by mouth daily. (Needs to be seen before next refill)  . RESTASIS 0.05 % ophthalmic emulsion    No facility-administered encounter medications on file as of 07/26/2018.     Review of Systems  Constitutional: Negative for chills and fever.  Eyes: Negative for visual disturbance.  Respiratory: Negative for chest tightness and shortness of breath.   Cardiovascular: Negative for chest pain and leg swelling.  Gastrointestinal: Positive for abdominal pain and diarrhea.  Negative for blood in stool, constipation, nausea, rectal pain and vomiting.  Genitourinary: Negative for difficulty urinating and dysuria.  Musculoskeletal: Negative for back pain and gait problem.  Skin: Negative for rash.  Neurological: Negative for light-headedness and headaches.  Psychiatric/Behavioral: Negative for agitation and behavioral problems.  All other systems reviewed and are negative.   Observations/Objective: Patient sounds comfortable and in no acute distress  Assessment and Plan: Problem List Items Addressed This Visit      Digestive   Irritable bowel syndrome - Primary   Relevant Medications   WELCHOL 625 MG tablet       Follow Up Instructions: Follow-up as needed, will see if she can get the prescription assistance through the county as well, gave the number for that.  Send brand-name because she feels like it does better for her.    I discussed the assessment and treatment plan with the patient. The patient was provided an opportunity to ask questions and all were answered. The patient agreed with the plan and demonstrated an understanding of the instructions.   The patient was advised to call back or seek an in-person evaluation if the symptoms worsen or if the condition fails to improve as anticipated.  The above assessment and management plan was discussed with the patient. The patient verbalized understanding of and has agreed to the management plan. Patient is aware to call the clinic if symptoms persist or worsen. Patient is aware when to return to the clinic for a follow-up visit. Patient educated on when it is appropriate to  go to the emergency department.    I provided 10 minutes of non-face-to-face time during this encounter.    Worthy Rancher, MD

## 2018-08-23 ENCOUNTER — Other Ambulatory Visit: Payer: Self-pay

## 2018-08-24 ENCOUNTER — Ambulatory Visit (INDEPENDENT_AMBULATORY_CARE_PROVIDER_SITE_OTHER): Payer: Medicare Other | Admitting: *Deleted

## 2018-08-24 DIAGNOSIS — Z23 Encounter for immunization: Secondary | ICD-10-CM

## 2018-10-29 ENCOUNTER — Other Ambulatory Visit: Payer: Self-pay | Admitting: Family Medicine

## 2018-11-22 ENCOUNTER — Telehealth: Payer: Self-pay | Admitting: Family Medicine

## 2018-11-23 ENCOUNTER — Ambulatory Visit: Payer: Medicare Other

## 2018-12-03 ENCOUNTER — Other Ambulatory Visit: Payer: Self-pay

## 2018-12-03 ENCOUNTER — Ambulatory Visit (INDEPENDENT_AMBULATORY_CARE_PROVIDER_SITE_OTHER): Payer: Medicare Other

## 2018-12-03 DIAGNOSIS — Z23 Encounter for immunization: Secondary | ICD-10-CM

## 2019-01-21 ENCOUNTER — Other Ambulatory Visit: Payer: Self-pay | Admitting: Family Medicine

## 2019-03-16 DIAGNOSIS — H40033 Anatomical narrow angle, bilateral: Secondary | ICD-10-CM | POA: Diagnosis not present

## 2019-03-16 DIAGNOSIS — H04123 Dry eye syndrome of bilateral lacrimal glands: Secondary | ICD-10-CM | POA: Diagnosis not present

## 2019-04-06 DIAGNOSIS — H04123 Dry eye syndrome of bilateral lacrimal glands: Secondary | ICD-10-CM | POA: Diagnosis not present

## 2019-04-10 DIAGNOSIS — K580 Irritable bowel syndrome with diarrhea: Secondary | ICD-10-CM | POA: Diagnosis not present

## 2019-07-19 ENCOUNTER — Other Ambulatory Visit: Payer: Self-pay | Admitting: Family Medicine

## 2019-07-19 MED ORDER — LOSARTAN POTASSIUM 50 MG PO TABS: 50.0000 mg | ORAL_TABLET | Freq: Every day | ORAL | 0 refills | Status: DC

## 2019-07-19 NOTE — Telephone Encounter (Signed)
30 days given.   

## 2019-07-19 NOTE — Telephone Encounter (Signed)
Last seen for HTN 2019 Last labs 2019 ntbs for refills

## 2019-08-02 ENCOUNTER — Other Ambulatory Visit: Payer: Self-pay

## 2019-08-02 ENCOUNTER — Ambulatory Visit (INDEPENDENT_AMBULATORY_CARE_PROVIDER_SITE_OTHER): Payer: Medicare PPO | Admitting: Family Medicine

## 2019-08-02 ENCOUNTER — Encounter: Payer: Self-pay | Admitting: Family Medicine

## 2019-08-02 VITALS — BP 126/81 | HR 74 | Ht 62.0 in | Wt 138.1 lb

## 2019-08-02 DIAGNOSIS — K580 Irritable bowel syndrome with diarrhea: Secondary | ICD-10-CM | POA: Diagnosis not present

## 2019-08-02 DIAGNOSIS — I10 Essential (primary) hypertension: Secondary | ICD-10-CM

## 2019-08-02 MED ORDER — LOSARTAN POTASSIUM 50 MG PO TABS: 50.0000 mg | ORAL_TABLET | Freq: Every day | ORAL | 3 refills | Status: DC

## 2019-08-02 MED ORDER — WELCHOL 625 MG PO TABS: 625.0000 mg | ORAL_TABLET | Freq: Two times a day (BID) | ORAL | 3 refills | Status: DC | PRN

## 2019-08-02 NOTE — Addendum Note (Signed)
Addended by: Arville Care on: 08/02/2019 02:31 PM   Modules accepted: Orders

## 2019-08-02 NOTE — Progress Notes (Signed)
BP 126/81   Pulse 74   Ht 5' 2" (1.575 m)   Wt 138 lb 2 oz (62.7 kg)   BMI 25.26 kg/m    Subjective:   Patient ID: Amanda Mayer, female    DOB: 02-08-1950, 69 y.o.   MRN: [id number]  HPI: Amanda Mayer is a 69 y.o. female presenting on 08/02/2019 for Medical Management of Chronic Issues and Hypertension   HPI  Irritable bowel syndrome Patient currently sees Dr. May God for gastroenterology and she takes the occasional Librax but mostly WelChol for this and the WelChol does seem to be keeping her under control.  Hypertension Patient is currently on losartan, and their blood pressure today is 126/81. Patient denies any lightheadedness or dizziness. Patient denies headaches, blurred vision, chest pains, shortness of breath, or weakness. Denies any side effects from medication and is content with current medication.   Relevant past medical, surgical, family and social history reviewed and updated as indicated. Interim medical history since our last visit reviewed. Allergies and medications reviewed and updated.  Review of Systems  Constitutional: Negative for chills and fever.  Eyes: Negative for visual disturbance.  Respiratory: Negative for chest tightness and shortness of breath.   Cardiovascular: Negative for chest pain and leg swelling.  Gastrointestinal: Negative for abdominal pain.  Musculoskeletal: Negative for back pain and gait problem.  Skin: Negative for rash.  Neurological: Negative for light-headedness and headaches.  Psychiatric/Behavioral: Negative for agitation and behavioral problems.  All other systems reviewed and are negative.   Per HPI unless specifically indicated above   Allergies as of 08/02/2019      Reactions   Codeine Other (See Comments), Rash   unknown hallucinations   Prednisone Other (See Comments), Rash   Swelling and joint pain      Medication List       Accurate as of August 02, 2019  2:29 PM. If you have any questions, ask your  nurse or doctor.        clidinium-chlordiazePOXIDE 5-2.5 MG capsule Commonly known as: LIBRAX Take 1 capsule by mouth 3 (three) times daily before meals.   losartan 50 MG tablet Commonly known as: COZAAR Take 1 tablet (50 mg total) by mouth daily.   Restasis 0.05 % ophthalmic emulsion Generic drug: cycloSPORINE   Welchol 625 MG tablet Generic drug: colesevelam Take 1 tablet (625 mg total) by mouth 2 (two) times daily as needed.        Objective:   BP 126/81   Pulse 74   Ht 5' 2" (1.575 m)   Wt 138 lb 2 oz (62.7 kg)   BMI 25.26 kg/m   Wt Readings from Last 3 Encounters:  08/02/19 138 lb 2 oz (62.7 kg)  11/24/17 137 lb 6.4 oz (62.3 kg)  10/23/17 138 lb 3.2 oz (62.7 kg)    Physical Exam Vitals and nursing note reviewed.  Constitutional:      General: She is not in acute distress.    Appearance: She is well-developed. She is not diaphoretic.  Eyes:     Conjunctiva/sclera: Conjunctivae normal.  Cardiovascular:     Rate and Rhythm: Normal rate and regular rhythm.     Heart sounds: Normal heart sounds. No murmur heard.   Pulmonary:     Effort: Pulmonary effort is normal. No respiratory distress.     Breath sounds: Normal breath sounds. No wheezing.  Musculoskeletal:        General: No tenderness. Normal range of motion.  Skin:  General: Skin is warm and dry.     Findings: No rash.  Neurological:     Mental Status: She is alert and oriented to person, place, and time.     Coordination: Coordination normal.  Psychiatric:        Behavior: Behavior normal.        Assessment & Plan:   Problem List Items Addressed This Visit      Cardiovascular and Mediastinum   Hypertension - Primary   Relevant Medications   WELCHOL 625 MG tablet   losartan (COZAAR) 50 MG tablet   Other Relevant Orders   CBC with Differential/Platelet   CMP14+EGFR   Lipid panel     Digestive   Irritable bowel syndrome   Relevant Medications   WELCHOL 625 MG tablet   Other  Relevant Orders   CBC with Differential/Platelet   CMP14+EGFR   Lipid panel      Continue WelChol losartan, she also continue seeing Dr. Daisey Must a good Librax from him. Follow up plan: Return in about 1 year (around 08/01/2020), or if symptoms worsen or fail to improve, for Hypertension recheck.  Counseling provided for all of the vaccine components Orders Placed This Encounter  Procedures  . CBC with Differential/Platelet  . CMP14+EGFR  . Lipid panel    Caryl Pina, MD [facility] 08/02/2019, 2:29 PM

## 2019-08-03 LAB — CBC WITH DIFFERENTIAL/PLATELET
Basophils Absolute: 0 10*3/uL (ref 0.0–0.2)
Basos: 0 %
EOS (ABSOLUTE): 0.3 10*3/uL (ref 0.0–0.4)
Eos: 3 %
Hematocrit: 41.6 % (ref 34.0–46.6)
Hemoglobin: 13.8 g/dL (ref 11.1–15.9)
Immature Grans (Abs): 0 10*3/uL (ref 0.0–0.1)
Immature Granulocytes: 0 %
Lymphocytes Absolute: 1.9 10*3/uL (ref 0.7–3.1)
Lymphs: 21 %
MCH: 29.4 pg (ref 26.6–33.0)
MCHC: 33.2 g/dL (ref 31.5–35.7)
MCV: 89 fL (ref 79–97)
Monocytes Absolute: 0.8 10*3/uL (ref 0.1–0.9)
Monocytes: 9 %
Neutrophils Absolute: 5.7 10*3/uL (ref 1.4–7.0)
Neutrophils: 67 %
Platelets: 303 10*3/uL (ref 150–450)
RBC: 4.69 x10E6/uL (ref 3.77–5.28)
RDW: 12.8 % (ref 11.7–15.4)
WBC: 8.7 10*3/uL (ref 3.4–10.8)

## 2019-08-03 LAB — CMP14+EGFR
ALT: 11 IU/L (ref 0–32)
AST: 17 IU/L (ref 0–40)
Albumin/Globulin Ratio: 2.4 — ABNORMAL HIGH (ref 1.2–2.2)
Albumin: 4.4 g/dL (ref 3.8–4.8)
Alkaline Phosphatase: 111 IU/L (ref 48–121)
BUN/Creatinine Ratio: 18 (ref 12–28)
BUN: 19 mg/dL (ref 8–27)
Bilirubin Total: 0.3 mg/dL (ref 0.0–1.2)
CO2: 23 mmol/L (ref 20–29)
Calcium: 9.4 mg/dL (ref 8.7–10.3)
Chloride: 102 mmol/L (ref 96–106)
Creatinine, Ser: 1.05 mg/dL — ABNORMAL HIGH (ref 0.57–1.00)
GFR calc Af Amer: 63 mL/min/{1.73_m2} (ref >59)
GFR calc non Af Amer: 55 mL/min/{1.73_m2} — ABNORMAL LOW (ref >59)
Globulin, Total: 1.8 g/dL (ref 1.5–4.5)
Glucose: 90 mg/dL (ref 65–99)
Potassium: 5.1 mmol/L (ref 3.5–5.2)
Sodium: 138 mmol/L (ref 134–144)
Total Protein: 6.2 g/dL (ref 6.0–8.5)

## 2019-08-03 LAB — LIPID PANEL
Chol/HDL Ratio: 3.8 ratio (ref 0.0–4.4)
Cholesterol, Total: 211 mg/dL — ABNORMAL HIGH (ref 100–199)
HDL: 55 mg/dL (ref >39)
LDL Chol Calc (NIH): 127 mg/dL — ABNORMAL HIGH (ref 0–99)
Triglycerides: 164 mg/dL — ABNORMAL HIGH (ref 0–149)
VLDL Cholesterol Cal: 29 mg/dL (ref 5–40)

## 2019-08-03 LAB — TSH: TSH: 3.1 u[IU]/mL (ref 0.450–4.500)

## 2019-08-12 MED ORDER — ATORVASTATIN CALCIUM 20 MG PO TABS
20.0000 mg | ORAL_TABLET | Freq: Every day | ORAL | 1 refills | Status: DC
Start: 2019-08-12 — End: 2020-09-18

## 2019-08-12 NOTE — Addendum Note (Signed)
Addended by: Lorelee Cover C on: 08/12/2019 12:52 PM   Modules accepted: Orders

## 2019-08-13 ENCOUNTER — Other Ambulatory Visit: Payer: Self-pay | Admitting: *Deleted

## 2019-08-13 DIAGNOSIS — K580 Irritable bowel syndrome with diarrhea: Secondary | ICD-10-CM

## 2019-08-13 MED ORDER — COLESEVELAM HCL 3.75 G PO PACK: 6.2500 g | PACK | Freq: Two times a day (BID) | ORAL | 3 refills | Status: DC | PRN

## 2019-08-13 NOTE — Telephone Encounter (Signed)
Name brand Lilian Kapur is not covered by pt insurance   The preferred is pended below - please take a look and see if this will be ok. If not, we can try to get a PA approval.

## 2019-08-20 ENCOUNTER — Telehealth: Payer: Self-pay | Admitting: *Deleted

## 2019-08-20 NOTE — Telephone Encounter (Signed)
Patient wants to give atorvastatin a try and then if that fails will go back to welchol.

## 2019-08-20 NOTE — Telephone Encounter (Signed)
We received PA for welchol on patient. I called pharmacy to check on because medication was changed to generic per insurance. Pharmacy states that patient want brand name only. I have called patient to request information on why she needs brand name only to try and complete PA.

## 2019-10-03 ENCOUNTER — Telehealth: Payer: Self-pay | Admitting: Family Medicine

## 2019-10-03 NOTE — Telephone Encounter (Signed)
I do not think that at this point the patient has any qualifying risk factors, this will likely change in the near future but she does not currently have any qualifying risk factors.

## 2019-10-03 NOTE — Telephone Encounter (Signed)
I do not see any dx or meds that would qualify patient for booster. Am I missing anything?

## 2019-10-04 ENCOUNTER — Telehealth: Payer: Self-pay | Admitting: Family Medicine

## 2019-10-04 NOTE — Telephone Encounter (Signed)
Patient aware.

## 2019-10-04 NOTE — Telephone Encounter (Signed)
LMTCB

## 2019-10-07 NOTE — Telephone Encounter (Signed)
Patient's has been taking atorvastatin to help with cholesterol and WelChol to help with her irritable bowels so yes I would run the prior authorization for it.

## 2019-10-07 NOTE — Telephone Encounter (Signed)
Called back to start PA for welchol 625.  There is phone note in patients chart from 07/31/19 and states that she wanted to try atorvastatin and then if not try the Welchol.  Attempted to contact patient to see if she is on the atorvastatin and if she is no PA is needed for [facility].

## 2019-10-07 NOTE — Telephone Encounter (Signed)
Patient is taking both medications- atorvastatin and Welchol? Refer to phone note from 07/31/19

## 2019-10-09 NOTE — Telephone Encounter (Signed)
Welchol not on current med list

## 2019-10-10 NOTE — Telephone Encounter (Signed)
WelChol is the same thing is the Colesevelam

## 2019-10-11 NOTE — Telephone Encounter (Signed)
PA states that medication is available without authorization.

## 2019-10-31 ENCOUNTER — Ambulatory Visit: Payer: BC Managed Care – PPO | Attending: Internal Medicine

## 2019-10-31 DIAGNOSIS — Z23 Encounter for immunization: Secondary | ICD-10-CM

## 2019-10-31 NOTE — Progress Notes (Signed)
   Covid-19 Vaccination Clinic  Name:  VERONIA LAPRISE    MRN: [id number] DOB: 03/16/1950  10/31/2019  Ms. Derflinger was observed post Covid-19 immunization for 15 minutes without incident. She was provided with Vaccine Information Sheet and instruction to access the V-Safe system.   Ms. Spayd was instructed to call 911 with any severe reactions post vaccine: Marland Kitchen Difficulty breathing  . Swelling of face and throat  . A fast heartbeat  . A bad rash all over body  . Dizziness and weakness

## 2020-08-03 ENCOUNTER — Ambulatory Visit: Payer: Medicare PPO | Admitting: Family Medicine

## 2020-08-04 ENCOUNTER — Encounter: Payer: Self-pay | Admitting: Family Medicine

## 2020-09-18 ENCOUNTER — Ambulatory Visit: Payer: Medicare PPO | Admitting: Family Medicine

## 2020-09-18 ENCOUNTER — Other Ambulatory Visit: Payer: Self-pay

## 2020-09-18 ENCOUNTER — Encounter: Payer: Self-pay | Admitting: Family Medicine

## 2020-09-18 VITALS — BP 144/77 | HR 78 | Temp 97.6°F | Resp 20 | Ht 62.0 in | Wt 134.0 lb

## 2020-09-18 DIAGNOSIS — I10 Essential (primary) hypertension: Secondary | ICD-10-CM

## 2020-09-18 DIAGNOSIS — E785 Hyperlipidemia, unspecified: Secondary | ICD-10-CM | POA: Insufficient documentation

## 2020-09-18 DIAGNOSIS — E782 Mixed hyperlipidemia: Secondary | ICD-10-CM

## 2020-09-18 MED ORDER — LOSARTAN POTASSIUM 50 MG PO TABS: 50.0000 mg | ORAL_TABLET | Freq: Every day | ORAL | 3 refills | Status: DC

## 2020-09-18 NOTE — Progress Notes (Signed)
BP (!) 144/77   Pulse 78   Temp 97.6 F (36.4 C) (Temporal)   Resp 20   Ht '5\' 2"'  (1.575 m)   Wt 134 lb (60.8 kg)   SpO2 98%   BMI 24.51 kg/m    Subjective:   Patient ID: Amanda Mayer, female    DOB: [birth year], 70 y.o.   MRN: [id number]  HPI: Amanda Mayer is a 70 y.o. female presenting on 09/18/2020 for Medication Refill   HPI Hypertension Patient is currently on losartan, and their blood pressure today is 144/77. Patient denies any lightheadedness or dizziness. Patient denies headaches, blurred vision, chest pains, shortness of breath, or weakness. Denies any side effects from medication and is content with current medication.   Hyperlipidemia Patient is coming in for recheck of his hyperlipidemia. The patient is currently taking no medication currently because she did not tolerate the atorvastatin. They deny any issues with myalgias or history of liver damage from it. They deny any focal numbness or weakness or chest pain.  She says she had a lot of muscle aches and had to stop it and we will check the levels and see what she needs today  Relevant past medical, surgical, family and social history reviewed and updated as indicated. Interim medical history since our last visit reviewed. Allergies and medications reviewed and updated.  Review of Systems  Constitutional:  Negative for chills and fever.  HENT:  Negative for congestion, ear discharge and ear pain.   Eyes:  Negative for redness and visual disturbance.  Respiratory:  Negative for chest tightness and shortness of breath.   Cardiovascular:  Negative for chest pain and leg swelling.  Genitourinary:  Negative for difficulty urinating and dysuria.  Musculoskeletal:  Negative for back pain and gait problem.  Skin:  Negative for rash.  Neurological:  Negative for light-headedness and headaches.  Psychiatric/Behavioral:  Negative for agitation and behavioral problems.   All other systems reviewed and are  negative.  Per HPI unless specifically indicated above   Allergies as of 09/18/2020       Reactions   Codeine Other (See Comments), Rash   unknown hallucinations   Prednisone Other (See Comments), Rash   Swelling and joint pain        Medication List        Accurate as of September 18, 2020  4:50 PM. If you have any questions, ask your nurse or doctor.          STOP taking these medications    atorvastatin 20 MG tablet Commonly known as: LIPITOR Stopped by: Fransisca Kaufmann Vasilis Luhman, MD   Colesevelam HCl 3.75 g Pack Stopped by: Fransisca Kaufmann Adjoa Althouse, MD       TAKE these medications    clidinium-chlordiazePOXIDE 5-2.5 MG capsule Commonly known as: LIBRAX Take 1 capsule by mouth 3 (three) times daily with meals as needed.   losartan 50 MG tablet Commonly known as: COZAAR Take 1 tablet (50 mg total) by mouth daily.   Restasis 0.05 % ophthalmic emulsion Generic drug: cycloSPORINE         Objective:   BP (!) 144/77   Pulse 78   Temp 97.6 F (36.4 C) (Temporal)   Resp 20   Ht '5\' 2"'  (1.575 m)   Wt 134 lb (60.8 kg)   SpO2 98%   BMI 24.51 kg/m   Wt Readings from Last 3 Encounters:  09/18/20 134 lb (60.8 kg)  08/02/19 138 lb 2 oz (62.7 kg)  11/24/17 137 lb 6.4 oz (62.3 kg)    Physical Exam Vitals and nursing note reviewed.  Constitutional:      General: She is not in acute distress.    Appearance: She is well-developed. She is not diaphoretic.  Eyes:     Conjunctiva/sclera: Conjunctivae normal.  Cardiovascular:     Rate and Rhythm: Normal rate and regular rhythm.     Heart sounds: Normal heart sounds. No murmur heard. Pulmonary:     Effort: Pulmonary effort is normal. No respiratory distress.     Breath sounds: Normal breath sounds. No wheezing.  Musculoskeletal:        General: No tenderness. Normal range of motion.  Skin:    General: Skin is warm and dry.     Findings: No rash.  Neurological:     Mental Status: She is alert and oriented to  person, place, and time.     Coordination: Coordination normal.  Psychiatric:        Behavior: Behavior normal.      Assessment & Plan:   Problem List Items Addressed This Visit       Cardiovascular and Mediastinum   Hypertension - Primary   Relevant Medications   losartan (COZAAR) 50 MG tablet   Other Relevant Orders   CBC with Differential/Platelet   CMP14+EGFR   Lipid panel     Other   Hyperlipidemia   Relevant Medications   losartan (COZAAR) 50 MG tablet   Other Relevant Orders   CBC with Differential/Platelet   CMP14+EGFR   Lipid panel    Continue current medication, we will see where her lab values are, may consider pravastatin with less muscle aches and see if she does better on that. Follow up plan: Return in about 1 year (around 09/18/2021), or if symptoms worsen or fail to improve, for Hypertension and cholesterol.  Counseling provided for all of the vaccine components Orders Placed This Encounter  Procedures   CBC with Differential/Platelet   CMP14+EGFR   Lipid panel    Caryl Pina, MD [facility] 09/18/2020, 4:50 PM

## 2020-09-19 LAB — LIPID PANEL
Chol/HDL Ratio: 3.9 ratio (ref 0.0–4.4)
Cholesterol, Total: 216 mg/dL — ABNORMAL HIGH (ref 100–199)
HDL: 56 mg/dL (ref >39)
LDL Chol Calc (NIH): 143 mg/dL — ABNORMAL HIGH (ref 0–99)
Triglycerides: 98 mg/dL (ref 0–149)
VLDL Cholesterol Cal: 17 mg/dL (ref 5–40)

## 2020-09-19 LAB — CMP14+EGFR
ALT: 14 IU/L (ref 0–32)
AST: 21 IU/L (ref 0–40)
Albumin/Globulin Ratio: 2.1 (ref 1.2–2.2)
Albumin: 4.5 g/dL (ref 3.8–4.8)
Alkaline Phosphatase: 105 IU/L (ref 44–121)
BUN/Creatinine Ratio: 18 (ref 12–28)
BUN: 17 mg/dL (ref 8–27)
Bilirubin Total: 0.3 mg/dL (ref 0.0–1.2)
CO2: 26 mmol/L (ref 20–29)
Calcium: 9.5 mg/dL (ref 8.7–10.3)
Chloride: 102 mmol/L (ref 96–106)
Creatinine, Ser: 0.96 mg/dL (ref 0.57–1.00)
Globulin, Total: 2.1 g/dL (ref 1.5–4.5)
Glucose: 99 mg/dL (ref 65–99)
Potassium: 4.2 mmol/L (ref 3.5–5.2)
Sodium: 141 mmol/L (ref 134–144)
Total Protein: 6.6 g/dL (ref 6.0–8.5)
eGFR: 64 mL/min/{1.73_m2} (ref >59)

## 2020-09-19 LAB — CBC WITH DIFFERENTIAL/PLATELET
Basophils Absolute: 0 10*3/uL (ref 0.0–0.2)
Basos: 0 %
EOS (ABSOLUTE): 0.2 10*3/uL (ref 0.0–0.4)
Eos: 2 %
Hematocrit: 40.9 % (ref 34.0–46.6)
Hemoglobin: 13.9 g/dL (ref 11.1–15.9)
Immature Grans (Abs): 0 10*3/uL (ref 0.0–0.1)
Immature Granulocytes: 0 %
Lymphocytes Absolute: 2.6 10*3/uL (ref 0.7–3.1)
Lymphs: 28 %
MCH: 29.1 pg (ref 26.6–33.0)
MCHC: 34 g/dL (ref 31.5–35.7)
MCV: 86 fL (ref 79–97)
Monocytes Absolute: 0.8 10*3/uL (ref 0.1–0.9)
Monocytes: 9 %
Neutrophils Absolute: 5.4 10*3/uL (ref 1.4–7.0)
Neutrophils: 61 %
Platelets: 305 10*3/uL (ref 150–450)
RBC: 4.77 x10E6/uL (ref 3.77–5.28)
RDW: 13 % (ref 11.7–15.4)
WBC: 9.1 10*3/uL (ref 3.4–10.8)

## 2020-09-30 ENCOUNTER — Encounter: Payer: Self-pay | Admitting: Family Medicine

## 2020-10-12 ENCOUNTER — Other Ambulatory Visit: Payer: Self-pay

## 2020-10-12 ENCOUNTER — Telehealth: Payer: Self-pay | Admitting: Family Medicine

## 2020-10-12 MED ORDER — ROSUVASTATIN CALCIUM 10 MG PO TABS
10.0000 mg | ORAL_TABLET | Freq: Every day | ORAL | 3 refills | Status: DC
Start: 2020-10-12 — End: 2022-04-25

## 2020-10-12 NOTE — Telephone Encounter (Signed)
Left message informing of results and asked  pt to call back if she has any concerns. Crestor 10mg  nightly #90 with 3 refills sent to [facility].

## 2020-11-09 ENCOUNTER — Telehealth: Payer: Self-pay | Admitting: Family Medicine

## 2020-11-09 NOTE — Telephone Encounter (Signed)
Left message for patient to call back and schedule Medicare Annual Wellness Visit    Last [facility] 10/02/17  please schedule at anytime with health coach  This should be a 45 minute visit.

## 2020-11-26 ENCOUNTER — Encounter: Payer: Self-pay | Admitting: Family Medicine

## 2020-11-26 ENCOUNTER — Ambulatory Visit (INDEPENDENT_AMBULATORY_CARE_PROVIDER_SITE_OTHER): Payer: Medicare PPO | Admitting: Family Medicine

## 2020-11-26 ENCOUNTER — Ambulatory Visit (INDEPENDENT_AMBULATORY_CARE_PROVIDER_SITE_OTHER): Payer: Medicare PPO

## 2020-11-26 VITALS — Ht 62.0 in | Wt 134.0 lb

## 2020-11-26 DIAGNOSIS — R0602 Shortness of breath: Secondary | ICD-10-CM

## 2020-11-26 DIAGNOSIS — J069 Acute upper respiratory infection, unspecified: Secondary | ICD-10-CM | POA: Diagnosis not present

## 2020-11-26 DIAGNOSIS — Z0000 Encounter for general adult medical examination without abnormal findings: Secondary | ICD-10-CM | POA: Diagnosis not present

## 2020-11-26 DIAGNOSIS — R051 Acute cough: Secondary | ICD-10-CM | POA: Diagnosis not present

## 2020-11-26 MED ORDER — BENZONATATE 100 MG PO CAPS
100.0000 mg | ORAL_CAPSULE | Freq: Three times a day (TID) | ORAL | 0 refills | Status: DC | PRN
Start: 2020-11-26 — End: 2021-11-29

## 2020-11-26 MED ORDER — ALBUTEROL SULFATE HFA 108 (90 BASE) MCG/ACT IN AERS: 2.0000 | INHALATION_SPRAY | Freq: Four times a day (QID) | RESPIRATORY_TRACT | 1 refills | Status: DC | PRN

## 2020-11-26 NOTE — Progress Notes (Signed)
Virtual Visit via Telephone Note  I connected with Amanda Mayer on 11/26/20 at 11:33 PM by telephone and verified that I am speaking with the correct person using two identifiers. Amanda Mayer is currently located at home and nobody is currently with her during this visit. The provider, Gwenlyn Fudge, FNP is located in their office at time of visit.  I discussed the limitations, risks, security and privacy concerns of performing an evaluation and management service by telephone and the availability of in person appointments. I also discussed with the patient that there may be a patient responsible charge related to this service. The patient expressed understanding and agreed to proceed.  Subjective: PCP: Dettinger, Elige Radon, MD  Chief Complaint  Patient presents with   URI   Patient complains of cough, head/chest congestion, runny nose, ear pain/pressure, shortness of breath, diarrhea, and fatigue . Onset of symptoms was 3 days ago, unchanged since that time. She is drinking moderate amounts of fluids. Evaluation to date: none. Treatment to date: antihistamines and Nyquil and antidiarrheal . She does not smoke.    ROS: Per HPI  Current Outpatient Medications:    clidinium-chlordiazePOXIDE (LIBRAX) 5-2.5 MG capsule, Take 1 capsule by mouth 3 (three) times daily with meals as needed., Disp: , Rfl:    losartan (COZAAR) 50 MG tablet, Take 1 tablet (50 mg total) by mouth daily., Disp: 90 tablet, Rfl: 3   RESTASIS 0.05 % ophthalmic emulsion, , Disp: , Rfl:    rosuvastatin (CRESTOR) 10 MG tablet, Take 1 tablet (10 mg total) by mouth daily., Disp: 90 tablet, Rfl: 3  Allergies  Allergen Reactions   Codeine Other (See Comments) and Rash    unknown hallucinations   Prednisone Other (See Comments) and Rash    Swelling and joint pain    Past Medical History:  Diagnosis Date   Allergy    Diverticulitis    Hypertension    Hypogammaglobulinemia (HCC)    IgA deficiency (HCC)     IgG deficiency (HCC)    IgM deficiency (HCC)    Irritable bowel     Observations/Objective: A&O  No respiratory distress or wheezing audible over the phone Mood, judgement, and thought processes all WNL  Assessment and Plan: 1. Viral URI Symptom management. Patient does not wish to come for influenza and COVID testing.  2. Shortness of breath - albuterol (VENTOLIN HFA) 108 (90 Base) MCG/ACT inhaler; Inhale 2 puffs into the lungs every 6 (six) hours as needed.  Dispense: 18 g; Refill: 1  3. Acute cough - benzonatate (TESSALON PERLES) 100 MG capsule; Take 1 capsule (100 mg total) by mouth 3 (three) times daily as needed for cough.  Dispense: 30 capsule; Refill: 0   Follow Up Instructions:  I discussed the assessment and treatment plan with the patient. The patient was provided an opportunity to ask questions and all were answered. The patient agreed with the plan and demonstrated an understanding of the instructions.   The patient was advised to call back or seek an in-person evaluation if the symptoms worsen or if the condition fails to improve as anticipated.  The above assessment and management plan was discussed with the patient. The patient verbalized understanding of and has agreed to the management plan. Patient is aware to call the clinic if symptoms persist or worsen. Patient is aware when to return to the clinic for a follow-up visit. Patient educated on when it is appropriate to go to the emergency department.  Time call ended: 11:45 AM  I provided 12 minutes of non-face-to-face time during this encounter.  Deliah Boston, MSN, APRN, FNP-C [facility] 11/26/20

## 2020-11-26 NOTE — Progress Notes (Signed)
Subjective:   Amanda Mayer is a 70 y.o. female who presents for Medicare Annual (Subsequent) preventive examination.  Virtual Visit via Telephone Note  I connected with  Amanda Mayer on 11/26/20 at  8:15 AM EDT by telephone and verified that I am speaking with the correct person using two identifiers.  Location: Patient: Home Provider: [facility] Persons participating in the virtual visit: patient/Nurse Health Advisor   I discussed the limitations, risks, security and privacy concerns of performing an evaluation and management service by telephone and the availability of in person appointments. The patient expressed understanding and agreed to proceed.  Interactive audio and video telecommunications were attempted between this nurse and patient, however failed, due to patient having technical difficulties OR patient did not have access to video capability.  We continued and completed visit with audio only.  Some vital signs may be absent or patient reported.   Amanda Mayer Amanda Jameriah Trotti, LPN   Review of Systems     Cardiac Risk Factors include: advanced age (>35men, >94 women);dyslipidemia;hypertension     Objective:    Today's Vitals   11/26/20 0821 11/26/20 0822  Weight: 134 lb (60.8 kg)   Height: 5\' 2"  (1.575 m)   PainSc:  4    Body mass index is 24.51 kg/m.  No flowsheet data found.  Current Medications (verified) Outpatient Encounter Medications as of 11/26/2020  Medication Sig   clidinium-chlordiazePOXIDE (LIBRAX) 5-2.5 MG capsule Take 1 capsule by mouth 3 (three) times daily with meals as needed.   losartan (COZAAR) 50 MG tablet Take 1 tablet (50 mg total) by mouth daily.   RESTASIS 0.05 % ophthalmic emulsion    rosuvastatin (CRESTOR) 10 MG tablet Take 1 tablet (10 mg total) by mouth daily.   No facility-administered encounter medications on file as of 11/26/2020.    Allergies (verified) Codeine and Prednisone   History: Past Medical History:  Diagnosis Date    Allergy    Diverticulitis    Hypertension    Hypogammaglobulinemia (HCC)    IgA deficiency (HCC)    IgG deficiency (HCC)    IgM deficiency (HCC)    Irritable bowel    Past Surgical History:  Procedure Laterality Date   CHOLECYSTECTOMY     COLONOSCOPY     Family History  Problem Relation Age of Onset   Hypertension Mother    Hypertension Father    Cancer Father        prostate   Diabetes Sister    Alcohol abuse Sister    COPD Sister    Miscarriages / Stillbirths Sister    Diabetes Brother    Alcohol abuse Brother    Hypertension Brother    Hearing loss Maternal Grandfather    Stroke Paternal Grandmother    Early death Paternal Grandfather    Alcohol abuse Brother    COPD Brother    Diabetes Brother    Diabetes Daughter    Social History   Socioeconomic History   Marital status: Widowed    Spouse name: Not on file   Number of children: Not on file   Years of education: Masters   Highest education level: Master's degree (Amanda.g., MA, MS, MEng, MEd, MSW, MBA)  Occupational History   Occupation: Retired  Tobacco Use   Smoking status: Never   Smokeless tobacco: Never  Vaping Use   Vaping Use: Never used  Substance and Sexual Activity   Alcohol use: No   Drug use: No   Sexual activity: Not Currently  Other Topics Concern   Not on file  Social History Narrative   Not on file   Social Determinants of Health   Financial Resource Strain: Unknown   Difficulty of Paying Living Expenses: Patient refused  Food Insecurity: Unknown   Worried About Programme researcher, broadcasting/film/video in the Last Year: Patient refused   Barista in the Last Year: Patient refused  Transportation Needs: Unknown   Freight forwarder (Medical): Patient refused   Lack of Transportation (Non-Medical): Patient refused  Physical Activity: Sufficiently Active   Days of Exercise per Week: 7 days   Minutes of Exercise per Session: 60 min  Stress: Not on file  Social Connections: Unknown    Frequency of Communication with Friends and Family: Patient refused   Frequency of Social Gatherings with Friends and Family: Patient refused   Attends Religious Services: Patient refused   Database administrator or Organizations: Patient refused   Attends Engineer, structural: Patient refused   Marital Status: Patient refused    Tobacco Counseling Counseling given: Not Answered   Clinical Intake:  Pre-visit preparation completed: Yes  Pain : 0-10 Pain Score: 4  Pain Type: Acute pain Pain Location: Abdomen Pain Descriptors / Indicators: Aching Pain Onset: More than a month ago Pain Frequency: Intermittent     BMI - recorded: 24.51 Nutritional Status: BMI of 19-24  Normal Nutritional Risks: Nausea/ vomitting/ diarrhea Diabetes: No  How often do you need to have someone help you when you read instructions, pamphlets, or other written materials from your doctor or pharmacy?: 1 - Never  Diabetic? no  Interpreter Needed?: No  Information entered by :: Amanda Bondy, LPN   Activities of Daily Living In your present state of health, do you have any difficulty performing the following activities: 11/26/2020  Hearing? N  Vision? N  Difficulty concentrating or making decisions? N  Walking or climbing stairs? N  Dressing or bathing? N  Doing errands, shopping? N  Preparing Food and eating ? N  Using the Toilet? N  In the past six months, have you accidently leaked urine? N  Do you have problems with loss of bowel control? N  Managing your Medications? N  Managing your Finances? N  Housekeeping or managing your Housekeeping? N  Some recent data might be hidden    Patient Care Team: Dettinger, Elige Radon, MD as PCP - General (Family Medicine) Vida Rigger, MD as Consulting Physician (Gastroenterology)  Indicate any recent Medical Services you may have received from other than [facility] providers in the past year (date may be approximate).     Assessment:   This is a  routine wellness examination for [place].  Hearing/Vision screen Hearing Screening - Comments:: Denies hearing difficulties  Vision Screening - Comments:: Wears rx glasses - up to date with annual eye exams with [facility]  Dietary issues and exercise activities discussed: Current Exercise Habits: Home exercise routine, Type of exercise: walking;strength training/weights;stretching, Time (Minutes): 60, Frequency (Times/Week): 7, Weekly Exercise (Minutes/Week): 420, Intensity: Moderate, Exercise limited by: None identified   Goals Addressed             This Visit's Progress    Exercise 3x per week (30 min per time)   On track    Try to exercise for at least 30 minutes, 3 times weekly       Depression Screen [facility] 2/9 Scores 11/26/2020 09/18/2020 08/02/2019 11/24/2017 10/23/2017 10/02/2017 01/13/2017  PHQ - 2 Score - 1  0 0 0 0 0  PHQ- 9 Score - 4 - - - - -  Exception Documentation Patient refusal - - - - - -    Fall Risk Fall Risk  11/26/2020 09/18/2020 08/02/2019 11/24/2017 10/23/2017  Falls in the past year? 0 0 1 0 No  Number falls in past yr: 0 - - - -  Injury with Fall? 0 - - - -  Risk for fall due to : No Fall Risks - - - -  Follow up Falls prevention discussed Falls evaluation completed - - -    FALL RISK PREVENTION PERTAINING TO THE HOME:  Any stairs in or around the home? Yes  If so, are there any without handrails? No  Home free of loose throw rugs in walkways, pet beds, electrical cords, etc? Yes  Adequate lighting in your home to reduce risk of falls? Yes   ASSISTIVE DEVICES UTILIZED TO PREVENT FALLS:  Life alert? No  Use of a cane, walker or w/c? No  Grab bars in the bathroom? No  Shower chair or bench in shower? No  Elevated toilet seat or a handicapped toilet? No   TIMED UP AND GO:  Was the test performed? No . Telephonic visit  Cognitive Function: declined today MMSE - Mini Mental State Exam 11/26/2020 10/02/2017  Not completed: Refused -   Orientation to time - 5  Orientation to Place - 5  Registration - 3  Attention/ Calculation - 5  Recall - 3  Language- name 2 objects - 2  Language- repeat - 1  Language- follow 3 step command - 3  Language- read & follow direction - 1  Write a sentence - 1  Copy design - 1  Total score - 30        Immunizations Immunization History  Administered Date(s) Administered   Fluad Quad(high Dose 65+) 12/03/2018   Influenza, High Dose Seasonal PF 11/24/2017   PFIZER(Purple Top)SARS-COV-2 Vaccination 02/15/2019, 03/18/2019, 10/31/2019   Pneumococcal Conjugate-13 05/09/2012   Pneumococcal Polysaccharide-23 10/23/2017   Tdap 12/31/2013   Zoster Recombinat (Shingrix) 03/20/2018, 08/24/2018   Zoster, Live 11/21/2013    TDAP status: Up to date  Flu Vaccine status: Due, Education has been provided regarding the importance of this vaccine. Advised may receive this vaccine at local pharmacy or Health Dept. Aware to provide a copy of the vaccination record if obtained from local pharmacy or Health Dept. Verbalized acceptance and understanding.  Pneumococcal vaccine status: Up to date  Covid-19 vaccine status: Completed vaccines  Qualifies for Shingles Vaccine? Yes   Zostavax completed Yes   Shingrix Completed?: Yes  Screening Tests Health Maintenance  Topic Date Due   Hepatitis C Screening  Never done   DEXA SCAN  Never done   COVID-19 Vaccine (4 - Booster for Pfizer series) 12/26/2019   MAMMOGRAM  03/20/2020   INFLUENZA VACCINE  08/24/2020   TETANUS/TDAP  01/01/2024   COLONOSCOPY (Pts 45-44yrs Insurance coverage will need to be confirmed)  08/05/2027   Pneumonia Vaccine 35+ Years old  Completed   Zoster Vaccines- Shingrix  Completed   HPV VACCINES  Aged Out    Health Maintenance  Health Maintenance Due  Topic Date Due   Hepatitis C Screening  Never done   DEXA SCAN  Never done   COVID-19 Vaccine (4 - Booster for Pfizer series) 12/26/2019   MAMMOGRAM  03/20/2020    INFLUENZA VACCINE  08/24/2020    Colorectal cancer screening: Type of screening: Colonoscopy. Completed 7/12/219. Repeat every  10 years  Mammogram status: Completed 03/20/2018. Repeat every year  Bone Density Scan: Due  Lung Cancer Screening: (Low Dose CT Chest recommended if Age 70-80 years, 30 pack-year currently smoking OR have quit w/in 15years.) does not qualify.   Additional Screening:  Hepatitis C Screening: does qualify; Due  Vision Screening: Recommended annual ophthalmology exams for early detection of glaucoma and other disorders of the eye. Is the patient up to date with their annual eye exam?  Yes  Who is the provider or what is the name of the office in which the patient attends annual eye exams? [facility] If pt is not established with a provider, would they like to be referred to a provider to establish care? No .   Dental Screening: Recommended annual dental exams for proper oral hygiene  Community Resource Referral / Chronic Care Management: CRR required this visit?  No   CCM required this visit?  No      Plan:     I have personally reviewed and noted the following in the patient's chart:   Medical and social history Use of alcohol, tobacco or illicit drugs  Current medications and supplements including opioid prescriptions.  Functional ability and status Nutritional status Physical activity Advanced directives List of other physicians Hospitalizations, surgeries, and ER visits in previous 12 months Vitals Screenings to include cognitive, depression, and falls Referrals and appointments  In addition, I have reviewed and discussed with patient certain preventive protocols, quality metrics, and best practice recommendations. A written personalized care plan for preventive services as well as general preventive health recommendations were provided to patient.     [place] Constable, LPN   [id number]   Nurse Notes: patient not feeling well  today - has been sick all week with congestion and upset stomach and brain fog. Refused to answer any SDOH questions - seemed offended by questions about falls, bladder control, etc. I do not feel that I was able to do a thorough visit before she got frustrated and hung up.

## 2020-11-26 NOTE — Patient Instructions (Signed)
Amanda Mayer , Thank you for taking time to come for your Medicare Wellness Visit. I appreciate your ongoing commitment to your health goals. Please review the following plan we discussed and let me know if I can assist you in the future.   Screening recommendations/referrals: Colonoscopy: Done 08/04/2017 - Repeat in 10 years  Mammogram: Done 03/20/2018 - Repeat annually *due Bone Density: Due *recommended every 2 years Recommended yearly ophthalmology/optometry visit for glaucoma screening and checkup Recommended yearly dental visit for hygiene and checkup  Vaccinations: Influenza vaccine: Done 12/03/2018 - Repeat annually *due Pneumococcal vaccine: Done 05/09/2012 & 10/23/2017 Tdap vaccine: Done 12/31/2013 - Repeat in 10 years  Shingles vaccine: Done 03/20/2018 & 08/24/2018   Covid-19: Done 02/15/2019, 03/18/2019, & 10/31/2019 - due for second booster  Advanced directives: Please bring a copy of your health care power of attorney and living will to the office to be added to your chart at your convenience.   Conditions/risks identified: Aim for 30 minutes of exercise or brisk walking each day, drink 6-8 glasses of water and eat lots of fruits and vegetables.   Next appointment: Follow up in one year for your annual wellness visit - If any of the screenings or vaccines have been updated, please let our office know and we will update your chart. Also, let us know if you'd like to schedule anything that is marked as "due". Thank you!   Preventive Care 69 Years and Older, Female Preventive care refers to lifestyle choices and visits with your health care provider that can promote health and wellness. What does preventive care include? A yearly physical exam. This is also called an annual well check. Dental exams once or twice a year. Routine eye exams. Ask your health care provider how often you should have your eyes checked. Personal lifestyle choices, including: Daily care of your teeth and  gums. Regular physical activity. Eating a healthy diet. Avoiding tobacco and drug use. Limiting alcohol use. Practicing safe sex. Taking low-dose aspirin every day. Taking vitamin and mineral supplements as recommended by your health care provider. What happens during an annual well check? The services and screenings done by your health care provider during your annual well check will depend on your age, overall health, lifestyle risk factors, and family history of disease. Counseling  Your health care provider may ask you questions about your: Alcohol use. Tobacco use. Drug use. Emotional well-being. Home and relationship well-being. Sexual activity. Eating habits. History of falls. Memory and ability to understand (cognition). Work and work Astronomer. Reproductive health. Screening  You may have the following tests or measurements: Height, weight, and BMI. Blood pressure. Lipid and cholesterol levels. These may be checked every 5 years, or more frequently if you are over 23 years old. Skin check. Lung cancer screening. You may have this screening every year starting at age 31 if you have a 30-pack-year history of smoking and currently smoke or have quit within the past 15 years. Fecal occult blood test (FOBT) of the stool. You may have this test every year starting at age 64. Flexible sigmoidoscopy or colonoscopy. You may have a sigmoidoscopy every 5 years or a colonoscopy every 10 years starting at age 69. Hepatitis C blood test. Hepatitis B blood test. Sexually transmitted disease (STD) testing. Diabetes screening. This is done by checking your blood sugar (glucose) after you have not eaten for a while (fasting). You may have this done every 1-3 years. Bone density scan. This is done to screen for osteoporosis.  You may have this done starting at age 36. Mammogram. This may be done every 1-2 years. Talk to your health care provider about how often you should have regular  mammograms. Talk with your health care provider about your test results, treatment options, and if necessary, the need for more tests. Vaccines  Your health care provider may recommend certain vaccines, such as: Influenza vaccine. This is recommended every year. Tetanus, diphtheria, and acellular pertussis (Tdap, Td) vaccine. You may need a Td booster every 10 years. Zoster vaccine. You may need this after age 42. Pneumococcal 13-valent conjugate (PCV13) vaccine. One dose is recommended after age 12. Pneumococcal polysaccharide (PPSV23) vaccine. One dose is recommended after age 87. Talk to your health care provider about which screenings and vaccines you need and how often you need them. This information is not intended to replace advice given to you by your health care provider. Make sure you discuss any questions you have with your health care provider. Document Released: 02/06/2015 Document Revised: 09/30/2015 Document Reviewed: 11/11/2014 Elsevier Interactive Patient Education  2017 [place] Prevention in the Home Falls can cause injuries. They can happen to people of all ages. There are many things you can do to make your home safe and to help prevent falls. What can I do on the outside of my home? Regularly fix the edges of walkways and driveways and fix any cracks. Remove anything that might make you trip as you walk through a door, such as a raised step or threshold. Trim any bushes or trees on the path to your home. Use bright outdoor lighting. Clear any walking paths of anything that might make someone trip, such as rocks or tools. Regularly check to see if handrails are loose or broken. Make sure that both sides of any steps have handrails. Any raised decks and porches should have guardrails on the edges. Have any leaves, snow, or ice cleared regularly. Use sand or salt on walking paths during winter. Clean up any spills in your garage right away. This includes oil  or grease spills. What can I do in the bathroom? Use night lights. Install grab bars by the toilet and in the tub and shower. Do not use towel bars as grab bars. Use non-skid mats or decals in the tub or shower. If you need to sit down in the shower, use a plastic, non-slip stool. Keep the floor dry. Clean up any water that spills on the floor as soon as it happens. Remove soap buildup in the tub or shower regularly. Attach bath mats securely with double-sided non-slip rug tape. Do not have throw rugs and other things on the floor that can make you trip. What can I do in the bedroom? Use night lights. Make sure that you have a light by your bed that is easy to reach. Do not use any sheets or blankets that are too big for your bed. They should not hang down onto the floor. Have a firm chair that has side arms. You can use this for support while you get dressed. Do not have throw rugs and other things on the floor that can make you trip. What can I do in the kitchen? Clean up any spills right away. Avoid walking on wet floors. Keep items that you use a lot in easy-to-reach places. If you need to reach something above you, use a strong step stool that has a grab bar. Keep electrical cords out of the way. Do not use  floor polish or wax that makes floors slippery. If you must use wax, use non-skid floor wax. Do not have throw rugs and other things on the floor that can make you trip. What can I do with my stairs? Do not leave any items on the stairs. Make sure that there are handrails on both sides of the stairs and use them. Fix handrails that are broken or loose. Make sure that handrails are as long as the stairways. Check any carpeting to make sure that it is firmly attached to the stairs. Fix any carpet that is loose or worn. Avoid having throw rugs at the top or bottom of the stairs. If you do have throw rugs, attach them to the floor with carpet tape. Make sure that you have a light  switch at the top of the stairs and the bottom of the stairs. If you do not have them, ask someone to add them for you. What else can I do to help prevent falls? Wear shoes that: Do not have high heels. Have rubber bottoms. Are comfortable and fit you well. Are closed at the toe. Do not wear sandals. If you use a stepladder: Make sure that it is fully opened. Do not climb a closed stepladder. Make sure that both sides of the stepladder are locked into place. Ask someone to hold it for you, if possible. Clearly mark and make sure that you can see: Any grab bars or handrails. First and last steps. Where the edge of each step is. Use tools that help you move around (mobility aids) if they are needed. These include: Canes. Walkers. Scooters. Crutches. Turn on the lights when you go into a dark area. Replace any light bulbs as soon as they burn out. Set up your furniture so you have a clear path. Avoid moving your furniture around. If any of your floors are uneven, fix them. If there are any pets around you, be aware of where they are. Review your medicines with your doctor. Some medicines can make you feel dizzy. This can increase your chance of falling. Ask your doctor what other things that you can do to help prevent falls. This information is not intended to replace advice given to you by your health care provider. Make sure you discuss any questions you have with your health care provider. Document Released: 11/06/2008 Document Revised: 06/18/2015 Document Reviewed: 02/14/2014 Elsevier Interactive Patient Education  2017 [employer].

## 2021-03-19 DIAGNOSIS — K580 Irritable bowel syndrome with diarrhea: Secondary | ICD-10-CM | POA: Diagnosis not present

## 2021-04-05 DIAGNOSIS — L259 Unspecified contact dermatitis, unspecified cause: Secondary | ICD-10-CM | POA: Diagnosis not present

## 2021-04-05 DIAGNOSIS — L718 Other rosacea: Secondary | ICD-10-CM | POA: Diagnosis not present

## 2021-10-18 ENCOUNTER — Encounter: Payer: Self-pay | Admitting: Family Medicine

## 2021-10-18 ENCOUNTER — Ambulatory Visit: Payer: Medicare PPO | Admitting: Family Medicine

## 2021-10-18 DIAGNOSIS — U071 COVID-19: Secondary | ICD-10-CM

## 2021-10-18 MED ORDER — NIRMATRELVIR/RITONAVIR (PAXLOVID)TABLET: 3.0000 | ORAL_TABLET | Freq: Two times a day (BID) | ORAL | 0 refills | Status: AC

## 2021-10-18 NOTE — Progress Notes (Signed)
Subjective:    Patient ID: Amanda Mayer, female    DOB: Sep 01, 1950, 71 y.o.   MRN: [id number]   HPI: Amanda Mayer is a 71 y.o. female presenting for cough, sneezing, chest hurts. Diarrhea. Fever, feels warm. Feeling dyspneic. Onset was yesterday. Eyes watering hurting. Exposed to [place].       09/18/2020    4:23 PM 08/02/2019    2:03 PM 11/24/2017    9:20 AM 10/23/2017    2:20 PM 10/02/2017    3:12 PM  Depression screen PHQ 2/9  Decreased Interest 0 0 0 0 0  Down, Depressed, Hopeless 1 0 0 0 0  PHQ - 2 Score 1 0 0 0 0  Altered sleeping 1      Tired, decreased energy 0      Change in appetite 1      Feeling bad or failure about yourself  1      Trouble concentrating 0      Moving slowly or fidgety/restless 0      Suicidal thoughts 0      PHQ-9 Score 4      Difficult doing work/chores Not difficult at all         Relevant past medical, surgical, family and social history reviewed and updated as indicated.  Interim medical history since our last visit reviewed. Allergies and medications reviewed and updated.  ROS:  Review of Systems  Constitutional:  Positive for fever. Negative for activity change, appetite change and chills.  HENT:  Positive for congestion and rhinorrhea. Negative for ear discharge, ear pain, postnasal drip, sinus pressure, sneezing and trouble swallowing.   Eyes:  Positive for pain and itching.  Respiratory:  Positive for cough and shortness of breath. Negative for chest tightness.   Cardiovascular:  Negative for chest pain and palpitations.  Skin:  Negative for rash.     Social History   Tobacco Use  Smoking Status Never  Smokeless Tobacco Never       Objective:     Wt Readings from Last 3 Encounters:  11/26/20 134 lb (60.8 kg)  09/18/20 134 lb (60.8 kg)  08/02/19 138 lb 2 oz (62.7 kg)     Exam deferred. Pt. Harboring due to COVID 19. Phone visit performed.   Assessment & Plan:   1. COVID-19 virus infection     Meds ordered  this encounter  Medications   nirmatrelvir/ritonavir EUA (PAXLOVID) 20 x 150 MG & 10 x 100MG  TABS    Sig: Take 3 tablets by mouth 2 (two) times daily for 5 days. (Take nirmatrelvir 150 mg two tablets twice daily for 5 days and ritonavir 100 mg one tablet twice daily for 5 days) Patient GFR is 64    Dispense:  30 tablet    Refill:  0    No orders of the defined types were placed in this encounter.     Diagnoses and all orders for this visit:  COVID-19 virus infection  Other orders -     nirmatrelvir/ritonavir EUA (PAXLOVID) 20 x 150 MG & 10 x 100MG  TABS; Take 3 tablets by mouth 2 (two) times daily for 5 days. (Take nirmatrelvir 150 mg two tablets twice daily for 5 days and ritonavir 100 mg one tablet twice daily for 5 days) Patient GFR is 64    Virtual Visit via telephone Note  I discussed the limitations, risks, security and privacy concerns of performing an evaluation and management service by telephone and the availability of  in person appointments. The patient was identified with two identifiers. Pt.expressed understanding and agreed to proceed. Pt. Is at home. Dr. Livia Snellen is in his office.  Follow Up Instructions:   I discussed the assessment and treatment plan with the patient. The patient was provided an opportunity to ask questions and all were answered. The patient agreed with the plan and demonstrated an understanding of the instructions.   The patient was advised to call back or seek an in-person evaluation if the symptoms worsen or if the condition fails to improve as anticipated.   Total minutes including chart review and phone contact time: 12   Follow up plan: Return if symptoms worsen or fail to improve.  Amanda Fraise, MD [facility]

## 2021-11-22 ENCOUNTER — Other Ambulatory Visit: Payer: Self-pay | Admitting: Family Medicine

## 2021-11-29 ENCOUNTER — Ambulatory Visit: Payer: Medicare PPO | Admitting: Family Medicine

## 2021-11-29 ENCOUNTER — Encounter: Payer: Self-pay | Admitting: Family Medicine

## 2021-11-29 DIAGNOSIS — R0602 Shortness of breath: Secondary | ICD-10-CM | POA: Diagnosis not present

## 2021-11-29 DIAGNOSIS — J40 Bronchitis, not specified as acute or chronic: Secondary | ICD-10-CM | POA: Diagnosis not present

## 2021-11-29 DIAGNOSIS — R051 Acute cough: Secondary | ICD-10-CM | POA: Diagnosis not present

## 2021-11-29 MED ORDER — ALBUTEROL SULFATE HFA 108 (90 BASE) MCG/ACT IN AERS: 2.0000 | INHALATION_SPRAY | Freq: Four times a day (QID) | RESPIRATORY_TRACT | 1 refills | Status: DC | PRN

## 2021-11-29 MED ORDER — BENZONATATE 100 MG PO CAPS: 100.0000 mg | ORAL_CAPSULE | Freq: Three times a day (TID) | ORAL | 0 refills | Status: DC | PRN

## 2021-11-29 MED ORDER — AMOXICILLIN 500 MG PO CAPS
500.0000 mg | ORAL_CAPSULE | Freq: Two times a day (BID) | ORAL | 0 refills | Status: DC
Start: 2021-11-29 — End: 2022-04-25

## 2021-11-29 NOTE — Progress Notes (Signed)
Virtual Visit via telephone Note  I connected with Amanda Mayer on 11/29/21 at 1055 by telephone and verified that I am speaking with the correct person using two identifiers. Amanda Mayer is currently located at home and patient are currently with her during visit. The provider, Fransisca Kaufmann Cosme Jacob, MD is located in their office at time of visit.  Call ended at 1105  I discussed the limitations, risks, security and privacy concerns of performing an evaluation and management service by telephone and the availability of in person appointments. I also discussed with the patient that there may be a patient responsible charge related to this service. The patient expressed understanding and agreed to proceed.   History and Present Illness: Patient is calling in for congestion.  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  She has a head cold and runny nose and congestion.  She has chest congestion.  She is day-quil and Claritin-d and hydrating.  She has a niece and nephew and they are ill with cough. She does not know what illness has been going around.  She had covid 1 month ago.  She is improved. She started 2 days ago with symptoms and they are getting worse.  She does feel a little short of breath. She is concerned about bronchitis.   1. Bronchitis   2. Shortness of breath   3. Acute cough     Outpatient Encounter Medications as of 11/29/2021  Medication Sig   amoxicillin (AMOXIL) 500 MG capsule Take 1 capsule (500 mg total) by mouth 2 (two) times daily.   albuterol (VENTOLIN HFA) 108 (90 Base) MCG/ACT inhaler Inhale 2 puffs into the lungs every 6 (six) hours as needed.   benzonatate (TESSALON PERLES) 100 MG capsule Take 1 capsule (100 mg total) by mouth 3 (three) times daily as needed for cough.   clidinium-chlordiazePOXIDE (LIBRAX) 5-2.5 MG capsule Take 1 capsule by mouth 3 (three) times daily with meals as needed.   losartan (COZAAR) 50 MG tablet TAKE ONE  TABLET ONCE DAILY   RESTASIS 0.05 % ophthalmic emulsion    rosuvastatin (CRESTOR) 10 MG tablet Take 1 tablet (10 mg total) by mouth daily.   [DISCONTINUED] albuterol (VENTOLIN HFA) 108 (90 Base) MCG/ACT inhaler Inhale 2 puffs into the lungs every 6 (six) hours as needed.   [DISCONTINUED] benzonatate (TESSALON PERLES) 100 MG capsule Take 1 capsule (100 mg total) by mouth 3 (three) times daily as needed for cough.   No facility-administered encounter medications on file as of 11/29/2021.    Review of Systems  Constitutional:  Positive for fever. Negative for chills.  HENT:  Positive for congestion, postnasal drip, rhinorrhea and sinus pressure. Negative for ear discharge, ear pain, sneezing and sore throat.   Eyes:  Negative for pain, redness and visual disturbance.  Respiratory:  Positive for cough and shortness of breath. Negative for chest tightness and wheezing.   Cardiovascular:  Negative for chest pain and leg swelling.  Genitourinary:  Negative for difficulty urinating and dysuria.  Musculoskeletal:  Negative for back pain and gait problem.  Skin:  Negative for rash.  Neurological:  Negative for light-headedness and headaches.  Psychiatric/Behavioral:  Negative for agitation and behavioral problems.   All other systems reviewed and are negative.   Observations/Objective: Patient sounds  comfortable and in no acute distress.   Assessment and Plan: Problem List Items Addressed This Visit   None Visit Diagnoses     Bronchitis    -  Primary   Relevant Medications   amoxicillin (AMOXIL) 500 MG capsule   albuterol (VENTOLIN HFA) 108 (90 Base) MCG/ACT inhaler   benzonatate (TESSALON PERLES) 100 MG capsule   Shortness of breath       Relevant Medications   albuterol (VENTOLIN HFA) 108 (90 Base) MCG/ACT inhaler   Acute cough       Relevant Medications   benzonatate (TESSALON PERLES) 100 MG capsule       Gave amoxicillin albuterol and Tessalon Perles, likely bronchitis based on  symptoms, recommended possible COVID testing and flu testing patient says she does not have a way to get in to do the testing.  Or a phone to call when she gets here to do the testing Follow up plan: Return if symptoms worsen or fail to improve.     I discussed the assessment and treatment plan with the patient. The patient was provided an opportunity to ask questions and all were answered. The patient agreed with the plan and demonstrated an understanding of the instructions.   The patient was advised to call back or seek an in-person evaluation if the symptoms worsen or if the condition fails to improve as anticipated.  The above assessment and management plan was discussed with the patient. The patient verbalized understanding of and has agreed to the management plan. Patient is aware to call the clinic if symptoms persist or worsen. Patient is aware when to return to the clinic for a follow-up visit. Patient educated on when it is appropriate to go to the emergency department.    I provided 10 minutes of non-face-to-face time during this encounter.    Worthy Rancher, MD

## 2021-12-03 ENCOUNTER — Ambulatory Visit (INDEPENDENT_AMBULATORY_CARE_PROVIDER_SITE_OTHER): Payer: Medicare PPO

## 2021-12-03 VITALS — Ht 62.5 in | Wt 135.0 lb

## 2021-12-03 DIAGNOSIS — Z0000 Encounter for general adult medical examination without abnormal findings: Secondary | ICD-10-CM | POA: Diagnosis not present

## 2021-12-03 NOTE — Progress Notes (Signed)
I connected with Amanda Mayer today by telephone and verified that I am speaking with the correct person using two identifiers. Location patient: home Location provider: work Persons participating in the virtual visit: [place], Kerce LPN.   I discussed the limitations, risks, security and privacy concerns of performing an evaluation and management service by telephone and the availability of in person appointments. I also discussed with the patient that there may be a patient responsible charge related to this service. The patient expressed understanding and verbally consented to this telephonic visit.    Interactive audio and video telecommunications were attempted between this provider and patient, however failed, due to patient having technical difficulties OR patient did not have access to video capability.  We continued and completed visit with audio only.     Vital signs may be patient reported or missing.  Subjective:   Amanda Mayer is a 71 y.o. female who presents for Medicare Annual (Subsequent) preventive examination.  Review of Systems     Cardiac Risk Factors include: advanced age (>95men, >31 women);dyslipidemia;hypertension     Objective:    Today's Vitals   12/03/21 1326 12/03/21 1327  Weight: 135 lb (61.2 kg)   Height: 5' 2.5" (1.588 m)   PainSc:  8    Body mass index is 24.3 kg/m.     12/03/2021    1:33 PM  Advanced Directives  Does Patient Have a Medical Advance Directive? No    Current Medications (verified) Outpatient Encounter Medications as of 12/03/2021  Medication Sig   albuterol (VENTOLIN HFA) 108 (90 Base) MCG/ACT inhaler Inhale 2 puffs into the lungs every 6 (six) hours as needed.   amoxicillin (AMOXIL) 500 MG capsule Take 1 capsule (500 mg total) by mouth 2 (two) times daily.   benzonatate (TESSALON PERLES) 100 MG capsule Take 1 capsule (100 mg total) by mouth 3 (three) times daily as needed for cough.    clidinium-chlordiazePOXIDE (LIBRAX) 5-2.5 MG capsule Take 1 capsule by mouth 3 (three) times daily with meals as needed.   losartan (COZAAR) 50 MG tablet TAKE ONE TABLET ONCE DAILY   RESTASIS 0.05 % ophthalmic emulsion    rosuvastatin (CRESTOR) 10 MG tablet Take 1 tablet (10 mg total) by mouth daily. (Patient not taking: Reported on 12/03/2021)   No facility-administered encounter medications on file as of 12/03/2021.    Allergies (verified) Codeine and Prednisone   History: 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)    Irritable bowel    Past Surgical History:  Procedure Laterality Date   CHOLECYSTECTOMY     COLONOSCOPY     Family History  Problem Relation Age of Onset   Hypertension Mother    Hypertension Father    Cancer Father        prostate   Diabetes Sister    Alcohol abuse Sister    COPD Sister    Miscarriages / Stillbirths Sister    Diabetes Brother    Alcohol abuse Brother    Hypertension Brother    Hearing loss Maternal Grandfather    Stroke Paternal Grandmother    Early death Paternal Grandfather    Alcohol abuse Brother    COPD Brother    Diabetes Brother    Diabetes Daughter    Social History   Socioeconomic History  Marital status: Widowed    Spouse name: Not on file   Number of children: Not on file   Years of education: Masters   Highest education level: Master's degree (e.g., MA, MS, MEng, MEd, MSW, MBA)  Occupational History   Occupation: Retired  Tobacco Use   Smoking status: Never   Smokeless tobacco: Never  Vaping Use   Vaping Use: Never used  Substance and Sexual Activity   Alcohol use: No   Drug use: No   Sexual activity: Not Currently  Other Topics Concern   Not on file  Social History Narrative   Not on file   Social Determinants of Health   Financial Resource Strain: Low Risk   (12/03/2021)   Overall Financial Resource Strain (CARDIA)    Difficulty of Paying Living Expenses: Not hard at all  Food Insecurity: No Food Insecurity (12/03/2021)   Hunger Vital Sign    Worried About Running Out of Food in the Last Year: Never true    Ran Out of Food in the Last Year: Never true  Transportation Needs: Unknown (11/26/2020)   PRAPARE - Transportation    Lack of Transportation (Medical): Patient refused    Lack of Transportation (Non-Medical): Patient refused  Physical Activity: Inactive (12/03/2021)   Exercise Vital Sign    Days of Exercise per Week: 0 days    Minutes of Exercise per Session: 0 min  Stress: No Stress Concern Present (12/03/2021)   [employer] of Occupational Health - Occupational Stress Questionnaire    Feeling of Stress : Not at all  Social Connections: Unknown (11/26/2020)   Social Connection and Isolation Panel [NHANES]    Frequency of Communication with Friends and Family: Patient refused    Frequency of Social Gatherings with Friends and Family: Patient refused    Attends Religious Services: Patient refused    Database administrator or Organizations: Patient refused    Attends Engineer, structural: Patient refused    Marital Status: Patient refused    Tobacco Counseling Counseling given: Not Answered   Clinical Intake:  Pre-visit preparation completed: Yes  Pain : 0-10 Pain Score: 8  Pain Type: Acute pain Pain Location: Chest Pain Descriptors / Indicators: Aching Pain Onset: 1 to 4 weeks ago Pain Frequency: Constant     Nutritional Status: BMI of 19-24  Normal Nutritional Risks: Nausea/ vomitting/ diarrhea (diarrhea since antibiotic) Diabetes: No  How often do you need to have someone help you when you read instructions, pamphlets, or other written materials from your doctor or pharmacy?: 1 - Never  Diabetic?no  Interpreter Needed?: No  Information entered by :: NAllen LPN   Activities of Daily Living     12/03/2021    1:35 PM  In your present state of health, do you have any difficulty performing the following activities:  Hearing? 0  Vision? 0  Difficulty concentrating or making decisions? 0  Walking or climbing stairs? 0  Dressing or bathing? 0  Doing errands, shopping? 0  Preparing Food and eating ? N  Using the Toilet? N  In the past six months, have you accidently leaked urine? N  Do you have problems with loss of bowel control? N  Managing your Medications? N  Managing your Finances? N  Housekeeping or managing your Housekeeping? N    Patient Care Team: Dettinger, Elige Radon, MD as PCP - General (Family Medicine) Vida Rigger, MD as Consulting Physician (Gastroenterology)  Indicate any recent Medical Services you may have received from other  than [facility] providers in the past year (date may be approximate).     Assessment:   This is a routine wellness examination for Biddle.  Hearing/Vision screen Vision Screening - Comments:: Regular eye exams, Dr. Conley Rolls  Dietary issues and exercise activities discussed: Current Exercise Habits: The patient does not participate in regular exercise at present   Goals Addressed             This Visit's Progress    Patient Stated       12/03/2021, wants to drink more water       Depression Screen    12/03/2021    1:35 PM 11/26/2020    8:45 AM 09/18/2020    4:23 PM 08/02/2019    2:03 PM 11/24/2017    9:20 AM 10/23/2017    2:20 PM 10/02/2017    3:12 PM  PHQ 2/9 Scores  PHQ - 2 Score 0  1 0 0 0 0  PHQ- 9 Score   4      Exception Documentation  Patient refusal         Fall Risk    12/03/2021    1:34 PM 11/26/2020    8:46 AM 09/18/2020    4:24 PM 08/02/2019    2:03 PM 11/24/2017    9:20 AM  Fall Risk   Falls in the past year? 1 0 0 1 0  Comment tripped over vacuum cleaner      Number falls in past yr: 0 0     Injury with Fall? 0 0     Risk for fall due to : Medication side effect No Fall Risks     Follow up Falls prevention  discussed;Education provided;Falls evaluation completed Falls prevention discussed Falls evaluation completed      FALL RISK PREVENTION PERTAINING TO THE HOME:  Any stairs in or around the home? Yes  If so, are there any without handrails? No  Home free of loose throw rugs in walkways, pet beds, electrical cords, etc? Yes  Adequate lighting in your home to reduce risk of falls? Yes   ASSISTIVE DEVICES UTILIZED TO PREVENT FALLS:  Life alert? No  Use of a cane, walker or w/c? No  Grab bars in the bathroom? No  Shower chair or bench in shower? No  Elevated toilet seat or a handicapped toilet? Yes   TIMED UP AND GO:  Was the test performed? No .      Cognitive Function:    11/26/2020    8:44 AM 10/02/2017    3:13 PM  MMSE - Mini Mental State Exam  Not completed: Refused   Orientation to time  5  Orientation to Place  5  Registration  3  Attention/ Calculation  5  Recall  3  Language- name 2 objects  2  Language- repeat  1  Language- follow 3 step command  3  Language- read & follow direction  1  Write a sentence  1  Copy design  1  Total score  30        12/03/2021    1:37 PM  6CIT Screen  What Year? 0 points  What month? 0 points  What time? 0 points  Count back from 20 0 points  Months in reverse 0 points  Repeat phrase 0 points  Total Score 0 points    Immunizations Immunization History  Administered Date(s) Administered   Fluad Quad(high Dose 65+) 12/03/2018   Influenza, High Dose Seasonal PF 11/24/2017   PFIZER(Purple  Top)SARS-COV-2 Vaccination 02/15/2019, 03/18/2019, 10/31/2019   Pneumococcal Conjugate-13 05/09/2012   Pneumococcal Polysaccharide-23 10/23/2017   Tdap 12/31/2013   Zoster Recombinat (Shingrix) 03/20/2018, 08/24/2018   Zoster, Live 11/21/2013    TDAP status: Up to date  Flu Vaccine status: Due, Education has been provided regarding the importance of this vaccine. Advised may receive this vaccine at local pharmacy or Health Dept.  Aware to provide a copy of the vaccination record if obtained from local pharmacy or Health Dept. Verbalized acceptance and understanding.  Pneumococcal vaccine status: Up to date  Covid-19 vaccine status: Completed vaccines  Qualifies for Shingles Vaccine? Yes   Zostavax completed Yes   Shingrix Completed?: Yes  Screening Tests Health Maintenance  Topic Date Due   Hepatitis C Screening  Never done   DEXA SCAN  Never done   COVID-19 Vaccine (4 - Pfizer risk series) 12/26/2019   MAMMOGRAM  03/20/2020   Medicare Annual Wellness (AWV)  11/26/2021   INFLUENZA VACCINE  04/24/2022 (Originally 08/24/2021)   TETANUS/TDAP  01/01/2024   COLONOSCOPY (Pts 45-98yrs Insurance coverage will need to be confirmed)  08/05/2027   Pneumonia Vaccine 103+ Years old  Completed   Zoster Vaccines- Shingrix  Completed   HPV VACCINES  Aged Out    Health Maintenance  Health Maintenance Due  Topic Date Due   Hepatitis C Screening  Never done   DEXA SCAN  Never done   COVID-19 Vaccine (4 - Pfizer risk series) 12/26/2019   MAMMOGRAM  03/20/2020   Medicare Annual Wellness (AWV)  11/26/2021    Colorectal cancer screening: Type of screening: Colonoscopy. Completed 08/04/2017. Repeat every 10 years  Mammogram status: declines  Bone Density status: due  Lung Cancer Screening: (Low Dose CT Chest recommended if Age 51-80 years, 30 pack-year currently smoking OR have quit w/in 15years.) does not qualify.   Lung Cancer Screening Referral: no  Additional Screening:  Hepatitis C Screening: does qualify;  Vision Screening: Recommended annual ophthalmology exams for early detection of glaucoma and other disorders of the eye. Is the patient up to date with their annual eye exam?  Yes  Who is the provider or what is the name of the office in which the patient attends annual eye exams? Dr. Conley Rolls If pt is not established with a provider, would they like to be referred to a provider to establish care? No .   Dental  Screening: Recommended annual dental exams for proper oral hygiene  Community Resource Referral / Chronic Care Management: CRR required this visit?  No   CCM required this visit?  No      Plan:     I have personally reviewed and noted the following in the patient's chart:   Medical and social history Use of alcohol, tobacco or illicit drugs  Current medications and supplements including opioid prescriptions. Patient is not currently taking opioid prescriptions. Functional ability and status Nutritional status Physical activity Advanced directives List of other physicians Hospitalizations, surgeries, and ER visits in previous 12 months Vitals Screenings to include cognitive, depression, and falls Referrals and appointments  In addition, I have reviewed and discussed with patient certain preventive protocols, quality metrics, and best practice recommendations. A written personalized care plan for preventive services as well as general preventive health recommendations were provided to patient.     Barb Merino, LPN   [id number]   Nurse Notes: none  Due to this being a virtual visit, the after visit summary with patients personalized plan was offered to patient via  mail or my-chart.  to pick up at office at next visit

## 2021-12-03 NOTE — Patient Instructions (Signed)
Amanda Mayer , Thank you for taking time to come for your Medicare Wellness Visit. I appreciate your ongoing commitment to your health goals. Please review the following plan we discussed and let me know if I can assist you in the future.   Screening recommendations/referrals: Colonoscopy: completed 08/04/2017, due 08/05/2027 Mammogram: decline Bone Density: due Recommended yearly ophthalmology/optometry visit for glaucoma screening and checkup Recommended yearly dental visit for hygiene and checkup  Vaccinations: Influenza vaccine: due Pneumococcal vaccine: completed 10/23/2017 Tdap vaccine: completed 12/31/2013, due 01/01/2024 Shingles vaccine: completed   Covid-19: 10/31/2019, 03/18/2019, 02/15/2019  Advanced directives: Advance directive discussed with you today.   Conditions/risks identified: none  Next appointment: Follow up in one year for your annual wellness visit    Preventive Care 65 Years and Older, Female Preventive care refers to lifestyle choices and visits with your health care provider that can promote health and wellness. What does preventive care include? A yearly physical exam. This is also called an annual well check. Dental exams once or twice a year. Routine eye exams. Ask your health care provider how often you should have your eyes checked. Personal lifestyle choices, including: Daily care of your teeth and gums. Regular physical activity. Eating a healthy diet. Avoiding tobacco and drug use. Limiting alcohol use. Practicing safe sex. Taking low-dose aspirin every day. Taking vitamin and mineral supplements as recommended by your health care provider. What happens during an annual well check? The services and screenings done by your health care provider during your annual well check will depend on your age, overall health, lifestyle risk factors, and family history of disease. Counseling  Your health care provider may ask you questions about your: Alcohol  use. Tobacco use. Drug use. Emotional well-being. Home and relationship well-being. Sexual activity. Eating habits. History of falls. Memory and ability to understand (cognition). Work and work Astronomer. Reproductive health. Screening  You may have the following tests or measurements: Height, weight, and BMI. Blood pressure. Lipid and cholesterol levels. These may be checked every 5 years, or more frequently if you are over 58 years old. Skin check. Lung cancer screening. You may have this screening every year starting at age 70 if you have a 30-pack-year history of smoking and currently smoke or have quit within the past 15 years. Fecal occult blood test (FOBT) of the stool. You may have this test every year starting at age 27. Flexible sigmoidoscopy or colonoscopy. You may have a sigmoidoscopy every 5 years or a colonoscopy every 10 years starting at age 70. Hepatitis C blood test. Hepatitis B blood test. Sexually transmitted disease (STD) testing. Diabetes screening. This is done by checking your blood sugar (glucose) after you have not eaten for a while (fasting). You may have this done every 1-3 years. Bone density scan. This is done to screen for osteoporosis. You may have this done starting at age 70. Mammogram. This may be done every 1-2 years. Talk to your health care provider about how often you should have regular mammograms. Talk with your health care provider about your test results, treatment options, and if necessary, the need for more tests. Vaccines  Your health care provider may recommend certain vaccines, such as: Influenza vaccine. This is recommended every year. Tetanus, diphtheria, and acellular pertussis (Tdap, Td) vaccine. You may need a Td booster every 10 years. Zoster vaccine. You may need this after age 70. Pneumococcal 13-valent conjugate (PCV13) vaccine. One dose is recommended after age 70. Pneumococcal polysaccharide (PPSV23) vaccine. One dose is  recommended after age 70. Talk to your health care provider about which screenings and vaccines you need and how often you need them. This information is not intended to replace advice given to you by your health care provider. Make sure you discuss any questions you have with your health care provider. Document Released: 02/06/2015 Document Revised: 09/30/2015 Document Reviewed: 11/11/2014 Elsevier Interactive Patient Education  2017 [place] Prevention in the Home Falls can cause injuries. They can happen to people of all ages. There are many things you can do to make your home safe and to help prevent falls. What can I do on the outside of my home? Regularly fix the edges of walkways and driveways and fix any cracks. Remove anything that might make you trip as you walk through a door, such as a raised step or threshold. Trim any bushes or trees on the path to your home. Use bright outdoor lighting. Clear any walking paths of anything that might make someone trip, such as rocks or tools. Regularly check to see if handrails are loose or broken. Make sure that both sides of any steps have handrails. Any raised decks and porches should have guardrails on the edges. Have any leaves, snow, or ice cleared regularly. Use sand or salt on walking paths during winter. Clean up any spills in your garage right away. This includes oil or grease spills. What can I do in the bathroom? Use night lights. Install grab bars by the toilet and in the tub and shower. Do not use towel bars as grab bars. Use non-skid mats or decals in the tub or shower. If you need to sit down in the shower, use a plastic, non-slip stool. Keep the floor dry. Clean up any water that spills on the floor as soon as it happens. Remove soap buildup in the tub or shower regularly. Attach bath mats securely with double-sided non-slip rug tape. Do not have throw rugs and other things on the floor that can make you  trip. What can I do in the bedroom? Use night lights. Make sure that you have a light by your bed that is easy to reach. Do not use any sheets or blankets that are too big for your bed. They should not hang down onto the floor. Have a firm chair that has side arms. You can use this for support while you get dressed. Do not have throw rugs and other things on the floor that can make you trip. What can I do in the kitchen? Clean up any spills right away. Avoid walking on wet floors. Keep items that you use a lot in easy-to-reach places. If you need to reach something above you, use a strong step stool that has a grab bar. Keep electrical cords out of the way. Do not use floor polish or wax that makes floors slippery. If you must use wax, use non-skid floor wax. Do not have throw rugs and other things on the floor that can make you trip. What can I do with my stairs? Do not leave any items on the stairs. Make sure that there are handrails on both sides of the stairs and use them. Fix handrails that are broken or loose. Make sure that handrails are as long as the stairways. Check any carpeting to make sure that it is firmly attached to the stairs. Fix any carpet that is loose or worn. Avoid having throw rugs at the top or bottom of the stairs. If you  do have throw rugs, attach them to the floor with carpet tape. Make sure that you have a light switch at the top of the stairs and the bottom of the stairs. If you do not have them, ask someone to add them for you. What else can I do to help prevent falls? Wear shoes that: Do not have high heels. Have rubber bottoms. Are comfortable and fit you well. Are closed at the toe. Do not wear sandals. If you use a stepladder: Make sure that it is fully opened. Do not climb a closed stepladder. Make sure that both sides of the stepladder are locked into place. Ask someone to hold it for you, if possible. Clearly mark and make sure that you can  see: Any grab bars or handrails. First and last steps. Where the edge of each step is. Use tools that help you move around (mobility aids) if they are needed. These include: Canes. Walkers. Scooters. Crutches. Turn on the lights when you go into a dark area. Replace any light bulbs as soon as they burn out. Set up your furniture so you have a clear path. Avoid moving your furniture around. If any of your floors are uneven, fix them. If there are any pets around you, be aware of where they are. Review your medicines with your doctor. Some medicines can make you feel dizzy. This can increase your chance of falling. Ask your doctor what other things that you can do to help prevent falls. This information is not intended to replace advice given to you by your health care provider. Make sure you discuss any questions you have with your health care provider. Document Released: 11/06/2008 Document Revised: 06/18/2015 Document Reviewed: 02/14/2014 Elsevier Interactive Patient Education  2017 [employer].

## 2021-12-13 DIAGNOSIS — L820 Inflamed seborrheic keratosis: Secondary | ICD-10-CM | POA: Diagnosis not present

## 2021-12-13 DIAGNOSIS — D224 Melanocytic nevi of scalp and neck: Secondary | ICD-10-CM | POA: Diagnosis not present

## 2022-01-05 ENCOUNTER — Ambulatory Visit: Payer: Medicare PPO | Admitting: Family Medicine

## 2022-01-05 ENCOUNTER — Encounter: Payer: Self-pay | Admitting: Family Medicine

## 2022-02-01 DIAGNOSIS — L578 Other skin changes due to chronic exposure to nonionizing radiation: Secondary | ICD-10-CM | POA: Diagnosis not present

## 2022-02-01 DIAGNOSIS — L820 Inflamed seborrheic keratosis: Secondary | ICD-10-CM | POA: Diagnosis not present

## 2022-02-24 ENCOUNTER — Encounter: Payer: Self-pay | Admitting: Family Medicine

## 2022-03-16 ENCOUNTER — Other Ambulatory Visit: Payer: Self-pay | Admitting: Family Medicine

## 2022-03-21 ENCOUNTER — Telehealth: Payer: Self-pay | Admitting: Family Medicine

## 2022-03-21 NOTE — Telephone Encounter (Signed)
Pt hasn't been seen for htn since 08/2020 but only acute visits and no labs since 2022. Ok to refill?

## 2022-03-21 NOTE — Telephone Encounter (Signed)
Patient has appointment scheduled for 04/01/2024and will need medication refilled for losartan (COZAAR) 50 MG tablet  until his appointment date.  Please advise. [employer]

## 2022-03-22 NOTE — Telephone Encounter (Signed)
Pt says that she is completely out of rx. She wants to talk to nurse about getting a refill. I explained to pt that the message was sent to Dettinger late yesterday and did not address. She is aware that he is off today and will address when he is in the office tomorrow. I also told her that Dettinger had to address the message because pt was last seen in 2022. Pt still asked to talk to nurse. Please call back

## 2022-03-23 MED ORDER — LOSARTAN POTASSIUM 50 MG PO TABS: 50.0000 mg | ORAL_TABLET | Freq: Every day | ORAL | 0 refills | Status: DC

## 2022-03-23 NOTE — Telephone Encounter (Signed)
It is fine to give enough to get through appt

## 2022-03-23 NOTE — Telephone Encounter (Signed)
Left message making pt aware that medication has been sent to [facility]. Advised pt to return call if needed.

## 2022-04-25 ENCOUNTER — Ambulatory Visit: Payer: Medicare PPO | Admitting: Family Medicine

## 2022-04-25 ENCOUNTER — Encounter: Payer: Self-pay | Admitting: Family Medicine

## 2022-04-25 VITALS — BP 138/83 | HR 74 | Temp 97.0°F | Ht 62.5 in | Wt 136.2 lb

## 2022-04-25 DIAGNOSIS — I10 Essential (primary) hypertension: Secondary | ICD-10-CM

## 2022-04-25 DIAGNOSIS — E782 Mixed hyperlipidemia: Secondary | ICD-10-CM

## 2022-04-25 MED ORDER — LOSARTAN POTASSIUM 50 MG PO TABS: 50.0000 mg | ORAL_TABLET | Freq: Every day | ORAL | 3 refills | Status: DC

## 2022-04-25 NOTE — Progress Notes (Signed)
BP 138/83   Pulse 74   Temp (!) 97 F (36.1 C)   Ht 5' 2.5" (1.588 m)   Wt 136 lb 3.2 oz (61.8 kg)   SpO2 97%   BMI 24.51 kg/m    Subjective:   Patient ID: Amanda Mayer, female    DOB: 04-16-1950, 72 y.o.   MRN: [id number]  HPI: Amanda Mayer is a 72 y.o. female presenting on 04/25/2022 for Medical Management of Chronic Issues   HPI Hypertension Patient is currently on losartan, and their blood pressure today is 138/83. Patient denies any lightheadedness or dizziness. Patient denies headaches, blurred vision, chest pains, shortness of breath, or weakness. Denies any side effects from medication and is content with current medication.   Hyperlipidemia Patient is coming in for recheck of his hyperlipidemia. The patient is currently taking no medicine, says she cannot tolerate statins and she says she never took the Crestor. They deny any issues with myalgias or history of liver damage from it. They deny any focal numbness or weakness or chest pain.   Relevant past medical, surgical, family and social history reviewed and updated as indicated. Interim medical history since our last visit reviewed. Allergies and medications reviewed and updated.  Review of Systems  Constitutional:  Negative for chills and fever.  HENT:  Negative for congestion, ear discharge and ear pain.   Eyes:  Negative for redness and visual disturbance.  Respiratory:  Negative for chest tightness and shortness of breath.   Cardiovascular:  Negative for chest pain and leg swelling.  Genitourinary:  Negative for difficulty urinating and dysuria.  Musculoskeletal:  Negative for back pain and gait problem.  Skin:  Negative for rash.  Neurological:  Negative for light-headedness and headaches.  Psychiatric/Behavioral:  Negative for agitation and behavioral problems.   All other systems reviewed and are negative.   Per HPI unless specifically indicated above   Allergies as of 04/25/2022       Reactions    Codeine Other (See Comments), Rash   unknown hallucinations   Prednisone Other (See Comments), Rash   Swelling and joint pain        Medication List        Accurate as of April 25, 2022  2:57 PM. If you have any questions, ask your nurse or doctor.          STOP taking these medications    amoxicillin 500 MG capsule Commonly known as: AMOXIL Stopped by: Fransisca Kaufmann Margrete Delude, MD   benzonatate 100 MG capsule Commonly known as: Best boy Stopped by: Worthy Rancher, MD   rosuvastatin 10 MG tablet Commonly known as: Crestor Stopped by: Worthy Rancher, MD       TAKE these medications    albuterol 108 (90 Base) MCG/ACT inhaler Commonly known as: VENTOLIN HFA Inhale 2 puffs into the lungs every 6 (six) hours as needed.   clidinium-chlordiazePOXIDE 5-2.5 MG capsule Commonly known as: LIBRAX Take 1 capsule by mouth 3 (three) times daily with meals as needed.   losartan 50 MG tablet Commonly known as: COZAAR Take 1 tablet (50 mg total) by mouth daily.   Restasis 0.05 % ophthalmic emulsion Generic drug: cycloSPORINE         Objective:   BP 138/83   Pulse 74   Temp (!) 97 F (36.1 C)   Ht 5' 2.5" (1.588 m)   Wt 136 lb 3.2 oz (61.8 kg)   SpO2 97%   BMI 24.51 kg/m  Wt Readings from Last 3 Encounters:  04/25/22 136 lb 3.2 oz (61.8 kg)  12/03/21 135 lb (61.2 kg)  11/26/20 134 lb (60.8 kg)    Physical Exam Vitals and nursing note reviewed.  Constitutional:      General: She is not in acute distress.    Appearance: She is well-developed. She is not diaphoretic.  Eyes:     Conjunctiva/sclera: Conjunctivae normal.  Cardiovascular:     Rate and Rhythm: Normal rate and regular rhythm.     Heart sounds: Normal heart sounds. No murmur heard. Pulmonary:     Effort: Pulmonary effort is normal. No respiratory distress.     Breath sounds: Normal breath sounds. No wheezing.  Musculoskeletal:        General: No swelling or tenderness. Normal  range of motion.  Skin:    General: Skin is warm and dry.     Findings: No rash.  Neurological:     Mental Status: She is alert and oriented to person, place, and time.     Coordination: Coordination normal.  Psychiatric:        Behavior: Behavior normal.       Assessment & Plan:   Problem List Items Addressed This Visit       Cardiovascular and Mediastinum   Hypertension - Primary   Relevant Medications   losartan (COZAAR) 50 MG tablet   Other Relevant Orders   CBC with Differential/Platelet   CMP14+EGFR   Lipid panel     Other   Hyperlipidemia   Relevant Medications   losartan (COZAAR) 50 MG tablet   Other Relevant Orders   CBC with Differential/Platelet   CMP14+EGFR   Lipid panel    Blood pressure looks decent, continue to losartan, will check blood work today. Follow up plan: Return in about 1 year (around 04/25/2023), or if symptoms worsen or fail to improve, for Physical exam and hypertension.  Counseling provided for all of the vaccine components Orders Placed This Encounter  Procedures   CBC with Differential/Platelet   CMP14+EGFR   Lipid panel    Caryl Pina, MD [facility] 04/25/2022, 2:57 PM

## 2022-04-26 LAB — CMP14+EGFR
ALT: 16 IU/L (ref 0–32)
AST: 21 IU/L (ref 0–40)
Albumin/Globulin Ratio: 2.4 — ABNORMAL HIGH (ref 1.2–2.2)
Albumin: 4.5 g/dL (ref 3.8–4.8)
Alkaline Phosphatase: 105 IU/L (ref 44–121)
BUN/Creatinine Ratio: 17 (ref 12–28)
BUN: 13 mg/dL (ref 8–27)
Bilirubin Total: 0.3 mg/dL (ref 0.0–1.2)
CO2: 25 mmol/L (ref 20–29)
Calcium: 9.6 mg/dL (ref 8.7–10.3)
Chloride: 104 mmol/L (ref 96–106)
Creatinine, Ser: 0.78 mg/dL (ref 0.57–1.00)
Globulin, Total: 1.9 g/dL (ref 1.5–4.5)
Glucose: 89 mg/dL (ref 70–99)
Potassium: 4.4 mmol/L (ref 3.5–5.2)
Sodium: 143 mmol/L (ref 134–144)
Total Protein: 6.4 g/dL (ref 6.0–8.5)
eGFR: 81 mL/min/{1.73_m2} (ref >59)

## 2022-04-26 LAB — CBC WITH DIFFERENTIAL/PLATELET
Basophils Absolute: 0 10*3/uL (ref 0.0–0.2)
Basos: 0 %
EOS (ABSOLUTE): 0.3 10*3/uL (ref 0.0–0.4)
Eos: 3 %
Hematocrit: 42 % (ref 34.0–46.6)
Hemoglobin: 13.9 g/dL (ref 11.1–15.9)
Immature Grans (Abs): 0 10*3/uL (ref 0.0–0.1)
Immature Granulocytes: 0 %
Lymphocytes Absolute: 2.4 10*3/uL (ref 0.7–3.1)
Lymphs: 26 %
MCH: 29.4 pg (ref 26.6–33.0)
MCHC: 33.1 g/dL (ref 31.5–35.7)
MCV: 89 fL (ref 79–97)
Monocytes Absolute: 0.8 10*3/uL (ref 0.1–0.9)
Monocytes: 9 %
Neutrophils Absolute: 5.8 10*3/uL (ref 1.4–7.0)
Neutrophils: 62 %
Platelets: 305 10*3/uL (ref 150–450)
RBC: 4.72 x10E6/uL (ref 3.77–5.28)
RDW: 13.1 % (ref 11.7–15.4)
WBC: 9.3 10*3/uL (ref 3.4–10.8)

## 2022-04-26 LAB — LIPID PANEL
Chol/HDL Ratio: 3.6 ratio (ref 0.0–4.4)
Cholesterol, Total: 218 mg/dL — ABNORMAL HIGH (ref 100–199)
HDL: 60 mg/dL (ref >39)
LDL Chol Calc (NIH): 138 mg/dL — ABNORMAL HIGH (ref 0–99)
Triglycerides: 112 mg/dL (ref 0–149)
VLDL Cholesterol Cal: 20 mg/dL (ref 5–40)

## 2022-05-05 DIAGNOSIS — H16223 Keratoconjunctivitis sicca, not specified as Sjogren's, bilateral: Secondary | ICD-10-CM | POA: Diagnosis not present

## 2022-05-05 DIAGNOSIS — H43813 Vitreous degeneration, bilateral: Secondary | ICD-10-CM | POA: Diagnosis not present

## 2022-05-05 DIAGNOSIS — H2513 Age-related nuclear cataract, bilateral: Secondary | ICD-10-CM | POA: Diagnosis not present

## 2022-05-05 DIAGNOSIS — H524 Presbyopia: Secondary | ICD-10-CM | POA: Diagnosis not present

## 2022-05-05 DIAGNOSIS — H5203 Hypermetropia, bilateral: Secondary | ICD-10-CM | POA: Diagnosis not present

## 2022-05-05 DIAGNOSIS — H52223 Regular astigmatism, bilateral: Secondary | ICD-10-CM | POA: Diagnosis not present

## 2022-05-10 ENCOUNTER — Other Ambulatory Visit: Payer: Self-pay

## 2022-05-10 MED ORDER — EZETIMIBE 10 MG PO TABS: 10.0000 mg | ORAL_TABLET | Freq: Every day | ORAL | 1 refills | Status: DC

## 2022-08-24 ENCOUNTER — Encounter: Payer: Self-pay | Admitting: Family Medicine

## 2022-08-24 ENCOUNTER — Ambulatory Visit: Payer: Medicare PPO | Admitting: Family Medicine

## 2022-08-24 VITALS — BP 139/76 | HR 78 | Ht 62.5 in | Wt 134.0 lb

## 2022-08-24 DIAGNOSIS — R0602 Shortness of breath: Secondary | ICD-10-CM | POA: Diagnosis not present

## 2022-08-24 DIAGNOSIS — L2089 Other atopic dermatitis: Secondary | ICD-10-CM

## 2022-08-24 DIAGNOSIS — J40 Bronchitis, not specified as acute or chronic: Secondary | ICD-10-CM | POA: Diagnosis not present

## 2022-08-24 MED ORDER — BENZONATATE 200 MG PO CAPS
200.0000 mg | ORAL_CAPSULE | Freq: Two times a day (BID) | ORAL | 1 refills | Status: DC | PRN
Start: 2022-08-24 — End: 2022-08-25

## 2022-08-24 MED ORDER — PREDNISONE 20 MG PO TABS
ORAL_TABLET | ORAL | 0 refills | Status: AC
Start: 2022-08-24 — End: ?

## 2022-08-24 MED ORDER — TRIAMCINOLONE ACETONIDE 0.1 % EX CREA: 1.0000 | TOPICAL_CREAM | Freq: Two times a day (BID) | CUTANEOUS | 0 refills | Status: DC

## 2022-08-24 MED ORDER — ALBUTEROL SULFATE HFA 108 (90 BASE) MCG/ACT IN AERS: 2.0000 | INHALATION_SPRAY | Freq: Four times a day (QID) | RESPIRATORY_TRACT | 1 refills | Status: DC | PRN

## 2022-08-24 NOTE — Progress Notes (Signed)
BP 139/76   Pulse 78   Ht 5' 2.5" (1.588 m)   Wt 134 lb (60.8 kg)   SpO2 97%   BMI 24.12 kg/m    Subjective:   Patient ID: Amanda Mayer, female    DOB: [birth year], 72 y.o.   MRN: [id number]  HPI: Amanda Mayer is a 72 y.o. female presenting on 08/24/2022 for Cough (Non productive- present for one week)   HPI Cough Patient is coming in today for nonproductive cough that is been going on over the past week.  She says she denies any fevers or chills but does have some cough and congestion.  The cough is nonproductive and she has been using some Tessalon Perles that she had from before as well as NyQuil and DayQuil and they have been helping but is just not clearing.  She denies any sick contacts that she knows of.  She did have an albuterol inhaler that she uses when she gets like this but she ran out of it and that is when the reason she is here today.  She denies any shortness of breath or wheezing that she knows of currently.  Relevant past medical, surgical, family and social history reviewed and updated as indicated. Interim medical history since our last visit reviewed. Allergies and medications reviewed and updated.  Review of Systems  Constitutional:  Negative for chills and fever.  HENT:  Positive for congestion. Negative for ear discharge, ear pain, postnasal drip, rhinorrhea, sinus pressure, sneezing and sore throat.   Eyes:  Negative for pain, redness and visual disturbance.  Respiratory:  Positive for cough. Negative for chest tightness, shortness of breath and wheezing.   Cardiovascular:  Negative for chest pain and leg swelling.  Genitourinary:  Negative for difficulty urinating and dysuria.  Musculoskeletal:  Negative for back pain and gait problem.  Skin:  Negative for rash.  Neurological:  Negative for light-headedness and headaches.  Psychiatric/Behavioral:  Negative for agitation and behavioral problems.   All other systems reviewed and are  negative.   Per HPI unless specifically indicated above   Allergies as of 08/24/2022       Reactions   Codeine Other (See Comments), Rash   unknown hallucinations        Medication List        Accurate as of August 24, 2022  4:01 PM. If you have any questions, ask your nurse or doctor.          STOP taking these medications    ezetimibe 10 MG tablet Commonly known as: Zetia Stopped by: Elige Radon Jamisha Hoeschen       TAKE these medications    albuterol 108 (90 Base) MCG/ACT inhaler Commonly known as: VENTOLIN HFA Inhale 2 puffs into the lungs every 6 (six) hours as needed.   benzonatate 200 MG capsule Commonly known as: TESSALON Take 1 capsule (200 mg total) by mouth 2 (two) times daily as needed for cough. Started by: Elige Radon Nana Hoselton   clidinium-chlordiazePOXIDE 5-2.5 MG capsule Commonly known as: LIBRAX Take 1 capsule by mouth 3 (three) times daily with meals as needed.   losartan 50 MG tablet Commonly known as: COZAAR Take 1 tablet (50 mg total) by mouth daily.   predniSONE 20 MG tablet Commonly known as: DELTASONE 2 po at same time daily for 5 days Started by: Elige Radon Gustabo Gordillo   Restasis 0.05 % ophthalmic emulsion Generic drug: cycloSPORINE   triamcinolone cream 0.1 % Commonly known as: KENALOG Apply 1  Application topically 2 (two) times daily. Started by: Elige Radon Rykin Route         Objective:   BP 139/76   Pulse 78   Ht 5' 2.5" (1.588 m)   Wt 134 lb (60.8 kg)   SpO2 97%   BMI 24.12 kg/m   Wt Readings from Last 3 Encounters:  08/24/22 134 lb (60.8 kg)  04/25/22 136 lb 3.2 oz (61.8 kg)  12/03/21 135 lb (61.2 kg)    Physical Exam Vitals and nursing note reviewed.  Constitutional:      General: She is not in acute distress.    Appearance: She is well-developed. She is not diaphoretic.  HENT:     Right Ear: Tympanic membrane and ear canal normal.     Left Ear: Tympanic membrane and ear canal normal.     Mouth/Throat:     Mouth:  Mucous membranes are moist.     Pharynx: Oropharynx is clear. No oropharyngeal exudate or posterior oropharyngeal erythema.  Eyes:     Conjunctiva/sclera: Conjunctivae normal.  Cardiovascular:     Rate and Rhythm: Normal rate and regular rhythm.     Heart sounds: Normal heart sounds. No murmur heard. Pulmonary:     Effort: Pulmonary effort is normal. No respiratory distress.     Breath sounds: Normal breath sounds. No wheezing, rhonchi or rales.  Musculoskeletal:        General: Normal range of motion.  Skin:    General: Skin is warm and dry.     Findings: No rash.  Neurological:     Mental Status: She is alert and oriented to person, place, and time.     Coordination: Coordination normal.  Psychiatric:        Behavior: Behavior normal.       Assessment & Plan:   Problem List Items Addressed This Visit   None Visit Diagnoses     Other atopic dermatitis    -  Primary   Relevant Medications   triamcinolone cream (KENALOG) 0.1 %   Bronchitis       Relevant Medications   albuterol (VENTOLIN HFA) 108 (90 Base) MCG/ACT inhaler   predniSONE (DELTASONE) 20 MG tablet   benzonatate (TESSALON) 200 MG capsule   Shortness of breath       Relevant Medications   albuterol (VENTOLIN HFA) 108 (90 Base) MCG/ACT inhaler   predniSONE (DELTASONE) 20 MG tablet   benzonatate (TESSALON) 200 MG capsule       Will treat like reactive airways, likely a small amount of bronchitis, likely allergic, do not see any signs of infection at this point.  Could be postviral syndrome.  Gave triamcinolone that she uses as needed for atopic dermatitis Follow up plan: Return if symptoms worsen or fail to improve.  Counseling provided for all of the vaccine components No orders of the defined types were placed in this encounter.   Arville Care, MD [facility] 08/24/2022, 4:01 PM

## 2022-08-25 MED ORDER — BENZONATATE 200 MG PO CAPS: 200.0000 mg | ORAL_CAPSULE | Freq: Two times a day (BID) | ORAL | 1 refills | Status: DC | PRN

## 2022-08-25 MED ORDER — ALBUTEROL SULFATE HFA 108 (90 BASE) MCG/ACT IN AERS: 2.0000 | INHALATION_SPRAY | Freq: Four times a day (QID) | RESPIRATORY_TRACT | 1 refills | Status: DC | PRN

## 2022-08-25 MED ORDER — TRIAMCINOLONE ACETONIDE 0.1 % EX CREA: 1.0000 | TOPICAL_CREAM | Freq: Two times a day (BID) | CUTANEOUS | 0 refills | Status: DC

## 2022-08-25 MED ORDER — PREDNISONE 20 MG PO TABS
ORAL_TABLET | ORAL | 0 refills | Status: DC
Start: 2022-08-25 — End: 2023-01-06

## 2022-08-25 NOTE — Addendum Note (Signed)
Addended by: Julious Payer D on: 08/25/2022 08:42 AM   Modules accepted: Orders

## 2022-08-25 NOTE — Progress Notes (Signed)
Scripts failed, resent

## 2022-08-25 NOTE — Addendum Note (Signed)
Addended by: Arville Care on: 08/25/2022 11:25 AM   Modules accepted: Orders

## 2022-11-25 ENCOUNTER — Encounter: Payer: Self-pay | Admitting: Family Medicine

## 2023-01-06 ENCOUNTER — Ambulatory Visit: Payer: Medicare PPO | Admitting: Family Medicine

## 2023-01-06 VITALS — BP 122/74 | HR 82 | Temp 98.3°F | Ht 62.5 in | Wt 137.0 lb

## 2023-01-06 DIAGNOSIS — K580 Irritable bowel syndrome with diarrhea: Secondary | ICD-10-CM

## 2023-01-06 DIAGNOSIS — R600 Localized edema: Secondary | ICD-10-CM

## 2023-01-06 MED ORDER — COLESEVELAM HCL 3.75 G PO PACK
3.7500 g | PACK | Freq: Every day | ORAL | 0 refills | Status: DC
Start: 2023-01-06 — End: 2023-01-23

## 2023-01-06 NOTE — Progress Notes (Unsigned)
Subjective:  Patient ID: Amanda Mayer, female    DOB: [birth year], 72 y.o.   MRN: [id number]  Patient Care Team: Dettinger, Elige Radon, MD as PCP - General (Family Medicine) Vida Rigger, MD as Consulting Physician (Gastroenterology)   Chief Complaint:  Medical Management of Chronic Issues (Patient is wanting a rx for welchol)  HPI: Amanda Mayer is a 72 y.o. female presenting on 01/06/2023 for Medical Management of Chronic Issues (Patient is wanting a rx for welchol) Patient presents today requested welchol for diarrhea related to her IBS. States that she has discomfort in her abdomen that she describes as "popping" and several episodes of diarrhea daily. States that she has 4-5 episodes daily. States that she is aching and painful. She is established with GI, Dr. Kathy Breach. She was previously on Librax but she has not been taking that recently.  Also reports that she has swelling in bilateral lower extremities. Reports that her toes are sore from it. She is not wearing compression stockings. She is elevating them during the day. States that it started several months ago and that she told her PCP about it in July, but they did not address it then.    Relevant past medical, surgical, family, and social history reviewed and updated as indicated.  Allergies and medications reviewed and updated. Data reviewed: Chart in Epic.   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)    Irritable bowel     Past Surgical History:  Procedure Laterality Date   CHOLECYSTECTOMY     COLONOSCOPY      Social History   Socioeconomic History   Marital status: Widowed    Spouse name: Not on file   Number of children: Not on file   Years of education: Masters   Highest education level: Master's degree (e.g., MA, MS, MEng, MEd, MSW, MBA)  Occupational  History   Occupation: Retired  Tobacco Use   Smoking status: Never   Smokeless tobacco: Never  Vaping Use   Vaping status: Never Used  Substance and Sexual Activity   Alcohol use: No   Drug use: No   Sexual activity: Not Currently  Other Topics Concern   Not on file  Social History Narrative   Not on file   Social Drivers of Health   Financial Resource Strain: Low Risk  (12/03/2021)   Overall Financial Resource Strain (CARDIA)    Difficulty of Paying Living Expenses: Not hard at all  Food Insecurity: No Food Insecurity (12/03/2021)   Hunger Vital Sign    Worried About Running Out of Food in the Last Year: Never true    Ran Out of Food in the Last Year: Never true  Transportation Needs: Unknown (11/26/2020)   PRAPARE - Transportation    Lack of Transportation (Medical): Patient declined    Lack of Transportation (Non-Medical): Patient declined  Physical Activity: Inactive (12/03/2021)   Exercise Vital Sign    Days of Exercise per Week: 0 days    Minutes of Exercise per Session: 0 min  Stress: No Stress Concern Present (12/03/2021)   [employer] of Occupational Health - Occupational Stress Questionnaire    Feeling of Stress : Not at all  Social Connections: Unknown (11/26/2020)   Social Connection and Isolation Panel [NHANES]    Frequency of Communication  with Friends and Family: Patient declined    Frequency of Social Gatherings with Friends and Family: Patient declined    Attends Religious Services: Patient declined    Database administrator or Organizations: Patient declined    Attends Banker Meetings: Patient declined    Marital Status: Patient declined  Intimate Partner Violence: Not At Risk (10/02/2017)   Humiliation, Afraid, Rape, and Kick questionnaire    Fear of Current or Ex-Partner: No    Emotionally Abused: No    Physically Abused: No    Sexually Abused: No    Outpatient Encounter Medications as of 01/06/2023  Medication Sig   albuterol  (VENTOLIN HFA) 108 (90 Base) MCG/ACT inhaler Inhale 2 puffs into the lungs every 6 (six) hours as needed.   clidinium-chlordiazePOXIDE (LIBRAX) 5-2.5 MG capsule Take 1 capsule by mouth 3 (three) times daily with meals as needed.   losartan (COZAAR) 50 MG tablet Take 1 tablet (50 mg total) by mouth daily.   RESTASIS 0.05 % ophthalmic emulsion    triamcinolone cream (KENALOG) 0.1 % Apply 1 Application topically 2 (two) times daily.   [DISCONTINUED] benzonatate (TESSALON) 200 MG capsule Take 1 capsule (200 mg total) by mouth 2 (two) times daily as needed for cough.   [DISCONTINUED] predniSONE (DELTASONE) 20 MG tablet 2 po at same time daily for 5 days   No facility-administered encounter medications on file as of 01/06/2023.    Allergies  Allergen Reactions   Codeine Other (See Comments) and Rash    unknown hallucinations    Review of Systems  Objective:  BP 122/74   Pulse 82   Temp 98.3 F (36.8 C)   Ht 5' 2.5" (1.588 m)   Wt 137 lb (62.1 kg)   SpO2 98%   BMI 24.66 kg/m    Wt Readings from Last 3 Encounters:  01/06/23 137 lb (62.1 kg)  08/24/22 134 lb (60.8 kg)  04/25/22 136 lb 3.2 oz (61.8 kg)    Physical Exam Constitutional:      General: She is awake. She is not in acute distress.    Appearance: Normal appearance. She is well-developed and well-groomed. She is not ill-appearing, toxic-appearing or diaphoretic.  Cardiovascular:     Rate and Rhythm: Normal rate and regular rhythm.     Pulses: Normal pulses.          Radial pulses are 2+ on the right side and 2+ on the left side.       Posterior tibial pulses are 2+ on the right side and 2+ on the left side.     Heart sounds: Normal heart sounds. No murmur heard.    No gallop.     Comments: Trace edema bilateral ankles  Pulmonary:     Effort: Pulmonary effort is normal. No respiratory distress.     Breath sounds: Normal breath sounds. No stridor. No wheezing, rhonchi or rales.  Abdominal:     Palpations: Abdomen is  soft.  Musculoskeletal:     Cervical back: Full passive range of motion without pain and neck supple.     Right lower leg: Edema present.     Left lower leg: Edema present.  Skin:    General: Skin is warm.     Capillary Refill: Capillary refill takes less than 2 seconds.  Neurological:     General: No focal deficit present.     Mental Status: She is alert, oriented to person, place, and time and easily aroused. Mental status is at  baseline.     GCS: GCS eye subscore is 4. GCS verbal subscore is 5. GCS motor subscore is 6.     Motor: No weakness.  Psychiatric:        Attention and Perception: Attention and perception normal.        Mood and Affect: Mood is anxious. Affect is inappropriate.        Speech: Speech is rapid and pressured and tangential.        Behavior: Behavior is agitated, aggressive and hyperactive. Behavior is cooperative.        Thought Content: Thought content normal. Thought content does not include homicidal or suicidal ideation. Thought content does not include homicidal or suicidal plan.        Cognition and Memory: Cognition and memory normal.        Judgment: Judgment is impulsive.     Comments: Patient laughing intermittently during questioning, patient avoiding eye contact,  Patient did not complete all sentences  Patient noncooperative at the end of the visit      Results for orders placed or performed in visit on 04/25/22  CBC with Differential/Platelet   Collection Time: 04/25/22  2:50 PM  Result Value Ref Range   WBC 9.3 3.4 - 10.8 x10E3/uL   RBC 4.72 3.77 - 5.28 x10E6/uL   Hemoglobin 13.9 11.1 - 15.9 g/dL   Hematocrit 62.1 30.8 - 46.6 %   MCV 89 79 - 97 fL   MCH 29.4 26.6 - 33.0 pg   MCHC 33.1 31.5 - 35.7 g/dL   RDW 65.7 84.6 - 96.2 %   Platelets 305 150 - 450 x10E3/uL   Neutrophils 62 Not Estab. %   Lymphs 26 Not Estab. %   Monocytes 9 Not Estab. %   Eos 3 Not Estab. %   Basos 0 Not Estab. %   Neutrophils Absolute 5.8 1.4 - 7.0 x10E3/uL    Lymphocytes Absolute 2.4 0.7 - 3.1 x10E3/uL   Monocytes Absolute 0.8 0.1 - 0.9 x10E3/uL   EOS (ABSOLUTE) 0.3 0.0 - 0.4 x10E3/uL   Basophils Absolute 0.0 0.0 - 0.2 x10E3/uL   Immature Granulocytes 0 Not Estab. %   Immature Grans (Abs) 0.0 0.0 - 0.1 x10E3/uL  CMP14+EGFR   Collection Time: 04/25/22  2:50 PM  Result Value Ref Range   Glucose 89 70 - 99 mg/dL   BUN 13 8 - 27 mg/dL   Creatinine, Ser 9.52 0.57 - 1.00 mg/dL   eGFR 81 >84 XL/KGM/0.10   BUN/Creatinine Ratio 17 12 - 28   Sodium 143 134 - 144 mmol/L   Potassium 4.4 3.5 - 5.2 mmol/L   Chloride 104 96 - 106 mmol/L   CO2 25 20 - 29 mmol/L   Calcium 9.6 8.7 - 10.3 mg/dL   Total Protein 6.4 6.0 - 8.5 g/dL   Albumin 4.5 3.8 - 4.8 g/dL   Globulin, Total 1.9 1.5 - 4.5 g/dL   Albumin/Globulin Ratio 2.4 (H) 1.2 - 2.2   Bilirubin Total 0.3 0.0 - 1.2 mg/dL   Alkaline Phosphatase 105 44 - 121 IU/L   AST 21 0 - 40 IU/L   ALT 16 0 - 32 IU/L  Lipid panel   Collection Time: 04/25/22  2:50 PM  Result Value Ref Range   Cholesterol, Total 218 (H) 100 - 199 mg/dL   Triglycerides 272 0 - 149 mg/dL   HDL 60 >53 mg/dL   VLDL Cholesterol Cal 20 5 - 40 mg/dL   LDL Chol Calc (NIH) 664 (  H) 0 - 99 mg/dL   Chol/HDL Ratio 3.6 0.0 - 4.4 ratio       01/06/2023    1:34 PM 08/24/2022    3:39 PM 04/25/2022    2:24 PM 12/03/2021    1:35 PM 09/18/2020    4:23 PM  Depression screen PHQ 2/9  Decreased Interest 0 0 0 0 0  Down, Depressed, Hopeless 0 0 0 0 1  PHQ - 2 Score 0 0 0 0 1  Altered sleeping 1 2   1   Tired, decreased energy 0 1   0  Change in appetite 1 1   1   Feeling bad or failure about yourself  1 0   1  Trouble concentrating 1 1   0  Moving slowly or fidgety/restless 1 0   0  Suicidal thoughts 0 0   0  PHQ-9 Score 5 5   4   Difficult doing work/chores Somewhat difficult Not difficult at all   Not difficult at all       01/06/2023    1:34 PM 08/24/2022    3:40 PM 09/18/2020    4:23 PM  GAD 7 : Generalized Anxiety Score  Nervous,  Anxious, on Edge 1 1 1   Control/stop worrying 1 0 0  Worry too much - different things 1 0 0  Trouble relaxing 1 1 1   Restless 0 1 1  Easily annoyed or irritable 0 0 0  Afraid - awful might happen 3 1 1   Total GAD 7 Score 7 4 4   Anxiety Difficulty  Not difficult at all Not difficult at all   Pertinent labs & imaging results that were available during my care of the patient were reviewed by me and considered in my medical decision making.  Assessment & Plan:  Marison was seen today for medical management of chronic issues.  Diagnoses and all orders for this visit:  Irritable bowel syndrome with diarrhea Order placed as below. Unable to review GI notes, not available in chart.  At the end of the visit patient became very disgruntled and did not wish to complete visit. She did not wish to follow up with PCP or specilaity as recommended by myself.  -     Colesevelam HCl (WELCHOL) 3.75 g PACK; Take 1 packet (3.75 g total) by mouth daily.  Bilateral lower extremity edema Encouraged patient to wear compression stockings, elevated, and ambulation. Encouraged patient to follow up with PCP. As above, she did not wish to complete end of visit and left exam room.    Continue all other maintenance medications.  Follow up plan: Return if symptoms worsen or fail to improve.   Neale Burly, DNP-FNP [facility] [address] [place], [place] [address] [phone]

## 2023-01-09 ENCOUNTER — Encounter: Payer: Self-pay | Admitting: Family Medicine

## 2023-01-23 ENCOUNTER — Telehealth: Payer: Self-pay | Admitting: *Deleted

## 2023-01-23 MED ORDER — COLESEVELAM HCL 625 MG PO TABS: 625.0000 mg | ORAL_TABLET | Freq: Two times a day (BID) | ORAL | 3 refills | Status: DC

## 2023-01-23 NOTE — Telephone Encounter (Signed)
Fax from [facility] RE: Welchol pack Note from pharmacy: pt says she has been taking Welchol tablet not packet Can Rx be changed please Please advise

## 2023-01-23 NOTE — Telephone Encounter (Signed)
Sent WelChol tablets for her

## 2023-01-23 NOTE — Addendum Note (Signed)
Addended by: Arville Care on: 01/23/2023 04:29 PM   Modules accepted: Orders

## 2023-02-01 DIAGNOSIS — L718 Other rosacea: Secondary | ICD-10-CM | POA: Diagnosis not present

## 2023-02-01 DIAGNOSIS — L578 Other skin changes due to chronic exposure to nonionizing radiation: Secondary | ICD-10-CM | POA: Diagnosis not present

## 2023-02-01 DIAGNOSIS — L820 Inflamed seborrheic keratosis: Secondary | ICD-10-CM | POA: Diagnosis not present

## 2023-02-01 DIAGNOSIS — D224 Melanocytic nevi of scalp and neck: Secondary | ICD-10-CM | POA: Diagnosis not present

## 2023-03-30 DIAGNOSIS — K580 Irritable bowel syndrome with diarrhea: Secondary | ICD-10-CM | POA: Diagnosis not present

## 2023-05-12 ENCOUNTER — Other Ambulatory Visit: Payer: Self-pay | Admitting: Family Medicine

## 2023-05-24 ENCOUNTER — Encounter: Payer: Self-pay | Admitting: Family Medicine

## 2023-06-30 ENCOUNTER — Ambulatory Visit: Admitting: Family Medicine

## 2023-06-30 ENCOUNTER — Encounter: Payer: Self-pay | Admitting: Family Medicine

## 2023-06-30 VITALS — BP 147/81 | HR 74 | Ht 62.5 in | Wt 138.0 lb

## 2023-06-30 DIAGNOSIS — J4520 Mild intermittent asthma, uncomplicated: Secondary | ICD-10-CM

## 2023-06-30 DIAGNOSIS — I10 Essential (primary) hypertension: Secondary | ICD-10-CM | POA: Diagnosis not present

## 2023-06-30 DIAGNOSIS — E782 Mixed hyperlipidemia: Secondary | ICD-10-CM

## 2023-06-30 DIAGNOSIS — K580 Irritable bowel syndrome with diarrhea: Secondary | ICD-10-CM | POA: Diagnosis not present

## 2023-06-30 DIAGNOSIS — L2089 Other atopic dermatitis: Secondary | ICD-10-CM | POA: Diagnosis not present

## 2023-06-30 DIAGNOSIS — J45909 Unspecified asthma, uncomplicated: Secondary | ICD-10-CM | POA: Insufficient documentation

## 2023-06-30 LAB — LIPID PANEL

## 2023-06-30 MED ORDER — LOSARTAN POTASSIUM 50 MG PO TABS: 50.0000 mg | ORAL_TABLET | Freq: Every day | ORAL | 3 refills | Status: AC

## 2023-06-30 MED ORDER — ALBUTEROL SULFATE HFA 108 (90 BASE) MCG/ACT IN AERS
2.0000 | INHALATION_SPRAY | Freq: Four times a day (QID) | RESPIRATORY_TRACT | 3 refills | Status: AC | PRN
Start: 2023-06-30 — End: ?

## 2023-06-30 MED ORDER — COLESEVELAM HCL 625 MG PO TABS: 625.0000 mg | ORAL_TABLET | Freq: Two times a day (BID) | ORAL | 3 refills | Status: AC

## 2023-06-30 MED ORDER — LEVOCETIRIZINE DIHYDROCHLORIDE 5 MG PO TABS: 5.0000 mg | ORAL_TABLET | Freq: Every evening | ORAL | 3 refills | Status: AC

## 2023-06-30 MED ORDER — TRIAMCINOLONE ACETONIDE 0.1 % EX CREA: 1.0000 | TOPICAL_CREAM | Freq: Two times a day (BID) | CUTANEOUS | 2 refills | Status: AC

## 2023-06-30 NOTE — Progress Notes (Signed)
 BP (!) 147/81   Pulse 74   Ht 5' 2.5" (1.588 m)   Wt 138 lb (62.6 kg)   SpO2 97%   BMI 24.84 kg/m    Subjective:   Patient ID: Amanda Mayer, female    DOB: [birth year], 73 y.o.   MRN: [id number]  HPI: Amanda Mayer is a 73 y.o. female presenting on 06/30/2023 for Medical Management of Chronic Issues and Hypertension   HPI Hypertension Patient is currently on losartan , and their blood pressure today is 147/81. Patient denies any lightheadedness or dizziness. Patient denies headaches, blurred vision, chest pains, shortness of breath, or weakness. Denies any side effects from medication and is content with current medication.   Hyperlipidemia Patient is coming in for recheck of his hyperlipidemia. The patient is currently taking WelChol . They deny any issues with myalgias or history of liver damage from it. They deny any focal numbness or weakness or chest pain.   Patient gets a little congestion bronchial irritation and uses albuterol  as needed and takes an antihistamine as well.  IBS Patient has a little bit IBS and takes WelChol  to help with regular bowel movements and seems to well with it.  Relevant past medical, surgical, family and social history reviewed and updated as indicated. Interim medical history since our last visit reviewed. Allergies and medications reviewed and updated.  Review of Systems  Constitutional:  Negative for chills and fever.  HENT:  Negative for congestion, ear discharge and ear pain.   Eyes:  Negative for redness and visual disturbance.  Respiratory:  Negative for chest tightness and shortness of breath.   Cardiovascular:  Negative for chest pain and leg swelling.  Genitourinary:  Negative for difficulty urinating and dysuria.  Musculoskeletal:  Negative for back pain and gait problem.  Skin:  Negative for rash.  Neurological:  Negative for dizziness, light-headedness and headaches.  Psychiatric/Behavioral:  Negative for agitation and  behavioral problems.   All other systems reviewed and are negative.   Per HPI unless specifically indicated above   Allergies as of 06/30/2023       Reactions   Codeine Other (See Comments), Rash   unknown hallucinations        Medication List        Accurate as of June 30, 2023 11:59 AM. If you have any questions, ask your nurse or doctor.          albuterol  108 (90 Base) MCG/ACT inhaler Commonly known as: VENTOLIN  HFA Inhale 2 puffs into the lungs every 6 (six) hours as needed.   clidinium-chlordiazePOXIDE 5-2.5 MG capsule Commonly known as: LIBRAX Take 1 capsule by mouth 3 (three) times daily with meals as needed.   colesevelam  625 MG tablet Commonly known as: WELCHOL  Take 1 tablet (625 mg total) by mouth 2 (two) times daily with a meal.   levocetirizine 5 MG tablet Commonly known as: XYZAL Take 1 tablet (5 mg total) by mouth every evening.   losartan  50 MG tablet Commonly known as: COZAAR  Take 1 tablet (50 mg total) by mouth daily. What changed: additional instructions Changed by: Lucio Sabin Jenin Birdsall   Restasis 0.05 % ophthalmic emulsion Generic drug: cycloSPORINE   triamcinolone  cream 0.1 % Commonly known as: KENALOG  Apply 1 Application topically 2 (two) times daily.         Objective:   BP (!) 147/81   Pulse 74   Ht 5' 2.5" (1.588 m)   Wt 138 lb (62.6 kg)   SpO2 97%  BMI 24.84 kg/m   Wt Readings from Last 3 Encounters:  06/30/23 138 lb (62.6 kg)  01/06/23 137 lb (62.1 kg)  08/24/22 134 lb (60.8 kg)    Physical Exam Vitals and nursing note reviewed.  Constitutional:      General: She is not in acute distress.    Appearance: She is well-developed. She is not diaphoretic.  Eyes:     Conjunctiva/sclera: Conjunctivae normal.  Cardiovascular:     Rate and Rhythm: Normal rate and regular rhythm.     Heart sounds: Normal heart sounds. No murmur heard. Pulmonary:     Effort: Pulmonary effort is normal. No respiratory distress.      Breath sounds: Normal breath sounds. No wheezing.  Musculoskeletal:        General: No swelling.  Skin:    General: Skin is warm and dry.     Findings: No rash.  Neurological:     Mental Status: She is alert and oriented to person, place, and time.     Coordination: Coordination normal.  Psychiatric:        Behavior: Behavior normal.       Assessment & Plan:   Problem List Items Addressed This Visit       Cardiovascular and Mediastinum   Hypertension - Primary   Relevant Medications   colesevelam  (WELCHOL ) 625 MG tablet   losartan  (COZAAR ) 50 MG tablet   Other Relevant Orders   CMP14+EGFR   CBC with Differential/Platelet   Lipid panel     Respiratory   Reactive airway disease   Relevant Medications   albuterol  (VENTOLIN  HFA) 108 (90 Base) MCG/ACT inhaler     Digestive   Irritable bowel syndrome   Relevant Orders   CMP14+EGFR   CBC with Differential/Platelet   Lipid panel     Other   Hyperlipidemia   Relevant Medications   colesevelam  (WELCHOL ) 625 MG tablet   losartan  (COZAAR ) 50 MG tablet   Other Relevant Orders   CMP14+EGFR   CBC with Differential/Platelet   Lipid panel   Other Visit Diagnoses       Other atopic dermatitis       Relevant Medications   triamcinolone  cream (KENALOG ) 0.1 %       Will check blood work today, will refill medications, she seems to be doing well and still sees gastroenterology. Follow up plan: Return in about 1 year (around 06/29/2024), or if symptoms worsen or fail to improve, for Physical exam.  Counseling provided for all of the vaccine components Orders Placed This Encounter  Procedures   CMP14+EGFR   CBC with Differential/Platelet   Lipid panel    Jolyne Needs, MD [facility] 06/30/2023, 11:59 AM

## 2023-07-01 LAB — CMP14+EGFR
ALT: 12 IU/L (ref 0–32)
AST: 18 IU/L (ref 0–40)
Albumin: 4.5 g/dL (ref 3.8–4.8)
Alkaline Phosphatase: 118 IU/L (ref 44–121)
BUN/Creatinine Ratio: 12 (ref 12–28)
BUN: 11 mg/dL (ref 8–27)
Bilirubin Total: 0.5 mg/dL (ref 0.0–1.2)
CO2: 21 mmol/L (ref 20–29)
Calcium: 9.3 mg/dL (ref 8.7–10.3)
Chloride: 103 mmol/L (ref 96–106)
Creatinine, Ser: 0.94 mg/dL (ref 0.57–1.00)
Globulin, Total: 2 g/dL (ref 1.5–4.5)
Glucose: 91 mg/dL (ref 70–99)
Potassium: 4.5 mmol/L (ref 3.5–5.2)
Sodium: 139 mmol/L (ref 134–144)
Total Protein: 6.5 g/dL (ref 6.0–8.5)
eGFR: 64 mL/min/{1.73_m2} (ref >59)

## 2023-07-01 LAB — LIPID PANEL
Chol/HDL Ratio: 3.7 ratio (ref 0.0–4.4)
Cholesterol, Total: 217 mg/dL — ABNORMAL HIGH (ref 100–199)
HDL: 58 mg/dL (ref >39)
LDL Chol Calc (NIH): 133 mg/dL — ABNORMAL HIGH (ref 0–99)
Triglycerides: 144 mg/dL (ref 0–149)
VLDL Cholesterol Cal: 26 mg/dL (ref 5–40)

## 2023-07-01 LAB — CBC WITH DIFFERENTIAL/PLATELET
Basophils Absolute: 0 10*3/uL (ref 0.0–0.2)
Basos: 1 %
EOS (ABSOLUTE): 0.2 10*3/uL (ref 0.0–0.4)
Eos: 3 %
Hematocrit: 44 % (ref 34.0–46.6)
Hemoglobin: 14.7 g/dL (ref 11.1–15.9)
Immature Grans (Abs): 0 10*3/uL (ref 0.0–0.1)
Immature Granulocytes: 0 %
Lymphocytes Absolute: 1.9 10*3/uL (ref 0.7–3.1)
Lymphs: 26 %
MCH: 29.5 pg (ref 26.6–33.0)
MCHC: 33.4 g/dL (ref 31.5–35.7)
MCV: 88 fL (ref 79–97)
Monocytes Absolute: 0.6 10*3/uL (ref 0.1–0.9)
Monocytes: 8 %
Neutrophils Absolute: 4.5 10*3/uL (ref 1.4–7.0)
Neutrophils: 62 %
Platelets: 324 10*3/uL (ref 150–450)
RBC: 4.99 x10E6/uL (ref 3.77–5.28)
RDW: 12.8 % (ref 11.7–15.4)
WBC: 7.2 10*3/uL (ref 3.4–10.8)

## 2023-07-06 ENCOUNTER — Ambulatory Visit: Payer: Self-pay | Admitting: Family Medicine

## 2023-07-06 MED ORDER — EZETIMIBE 10 MG PO TABS: 10.0000 mg | ORAL_TABLET | Freq: Every day | ORAL | 1 refills | Status: DC

## 2023-07-12 ENCOUNTER — Telehealth: Payer: Self-pay | Admitting: Pharmacist

## 2023-07-12 NOTE — Telephone Encounter (Addendum)
   Hyperlipidemia  Lipid Panel     Component Value Date/Time   CHOL 217 (H) 06/30/2023 1203   TRIG 144 06/30/2023 1203   HDL 58 06/30/2023 1203   CHOLHDL 3.7 06/30/2023 1203   LDLCALC 133 (H) 06/30/2023 1203   LABVLDL 26 06/30/2023 1203   Discussed HLD case with PCP  LDL cholesterol is 133 which is still elevated (goal LDL<100)  Kidney function and liver function look good.  She reports she cannot tolerate statins.  PCP recommended rosuvastatin  5 mg nightly vs. Zetia  10 mg nightly which is not a statin.    Follow up PharmD appt July   Mliss Tarry Griffin, PharmD, BCACP, CPP Clinical Pharmacist, [facility]

## 2023-07-27 ENCOUNTER — Telehealth: Payer: Self-pay | Admitting: Pharmacist

## 2023-07-27 ENCOUNTER — Other Ambulatory Visit (INDEPENDENT_AMBULATORY_CARE_PROVIDER_SITE_OTHER): Admitting: Pharmacist

## 2023-07-27 DIAGNOSIS — E782 Mixed hyperlipidemia: Secondary | ICD-10-CM

## 2023-07-27 DIAGNOSIS — E785 Hyperlipidemia, unspecified: Secondary | ICD-10-CM

## 2023-07-27 MED ORDER — REPATHA SURECLICK 140 MG/ML ~~LOC~~ SOAJ: 140.0000 mg | SUBCUTANEOUS | 2 refills | Status: DC

## 2023-07-27 NOTE — Progress Notes (Signed)
 07/27/2023 Name: Amanda Mayer MRN: [id number] DOB: 1950-10-26  Chief Complaint  Patient presents with   Hyperlipidemia    Amanda Mayer is a 73 y.o. year old female who presented for a telephone visit.  I connected with  Amanda Mayer on 07/27/23 by telephone and verified that I am speaking with the correct person using two identifiers.  I discussed the limitations of evaluation and management by telemedicine. The patient expressed understanding and agreed to proceed.  Patient was located in her home and PharmD in PCP office during this visit.   They were referred to the pharmacist by their PCP for assistance in managing hyperlipidemia.    Subjective:  Patient reports she cannot take statins due to hip pain.  She has tried and failed multiple ones (atorvastatin , rosuvastatin ).  She is currently tolerating welchol  and newly added ezetimibe  (tolerated for 3 weeks now).  Welchol  was chosen due to IBS and inability to tolerate statins.  She is open to exploring injectables such as PCSK9s (I.e. Repatha), but will need to seek insurance approval. She remains motivated to continue to improve her diet and lifestyle  Care Team: Primary Care Provider: Dettinger, Fonda LABOR, MD   Medication Access/Adherence  Current Pharmacy:  [facility] [place], [place] - [address] [place] [address] Phone: [phone] Fax: [phone]  [facility] [address], [place] - [address] [place] [address] Phone: [phone] Fax: [phone]  Patient reports affordability concerns with their medications: No ; brand name Repatha may be costly Patient reports access/transportation concerns to their pharmacy: No  Patient reports adherence concerns with their medications:  Yes  statins; can't tolerate   Hyperlipidemia/ASCVD Risk Reduction  Current lipid lowering medications: welchol , ezetimibe  Medications tried in the past:  atorvastatin , rosuvastatin   Antiplatelet regimen: none  ASCVD History: n/a Family History: stroke father Risk Factors: hypertension   Current physical activity: reports very active  Current medication access support: n/a [employer]  Clinical ASCVD: No  The 10-year ASCVD risk score (Arnett DK, et al., 2019) is: 20.2%   Values used to calculate the score:     Age: 41 years     Clincally relevant sex: Female     Is Non-Hispanic African American: No     Diabetic: No     Tobacco smoker: No     Systolic Blood Pressure: 147 mmHg     Is BP treated: Yes     HDL Cholesterol: 58 mg/dL     Total Cholesterol: 217 mg/dL    Objective:  Lab Results  Component Value Date   CREATININE 0.94 06/30/2023   BUN 11 06/30/2023   NA 139 06/30/2023   K 4.5 06/30/2023   CL 103 06/30/2023   CO2 21 06/30/2023    Lab Results  Component Value Date   CHOL 217 (H) 06/30/2023   HDL 58 06/30/2023   LDLCALC 133 (H) 06/30/2023   TRIG 144 06/30/2023   CHOLHDL 3.7 06/30/2023    Medications Reviewed Today     Reviewed by Billee Mliss BIRCH, [facility] (Pharmacist) on 07/27/23 at 0912  Med List Status: <None>   Medication Order Taking? Sig Documenting Provider Last Dose Status Informant  albuterol  (VENTOLIN  HFA) 108 (90 Base) MCG/ACT inhaler [id number]  Inhale 2 puffs into the lungs every 6 (six) hours as needed. Dettinger, Fonda LABOR, MD  Active   clidinium-chlordiazePOXIDE (LIBRAX) 5-2.5 MG capsule [id number] No Take  1 capsule by mouth 3 (three) times daily with meals as needed. [provider] Taking Active   colesevelam  (WELCHOL ) 625 MG tablet [id number]  Take 1 tablet (625 mg total) by mouth 2 (two) times daily with a meal. Dettinger, Fonda LABOR, MD  Active   ezetimibe  (ZETIA ) 10 MG tablet [id number]  Take 1 tablet (10 mg total) by mouth daily. Dettinger, Fonda LABOR, MD  Active   levocetirizine (XYZAL ) 5 MG tablet [id number]  Take 1 tablet (5 mg total) by mouth every evening. Dettinger, Fonda LABOR, MD   Active   losartan  (COZAAR ) 50 MG tablet [id number]  Take 1 tablet (50 mg total) by mouth daily. Dettinger, Fonda LABOR, MD  Active   RESTASIS 0.05 % ophthalmic emulsion [id number] No  [provider] Taking Active   triamcinolone  cream (KENALOG ) 0.1 % [id number]  Apply 1 Application topically 2 (two) times daily. Dettinger, Fonda LABOR, MD  Active             Assessment/Plan:   Hyperlipidemia/ASCVD Risk Reduction: - Currently uncontrolled. Goal LDL<100 - Reviewed long term complications of uncontrolled cholesterol - Reviewed dietary recommendations including FOLLOWING A HEART HEALTHY DIET/HEALTHY PLATE METHOD - Reviewed lifestyle recommendations including increased physical activity - Recommend to : Explore Repatha coverage (will require prior auth and medication assistance) Continue Welchol  and ezetimibe  for now Will connect with patient in 5-7 business days to follow up on the status of Repatha - Meets financial criteria for Repatha patient assistance program through [employer]. Will collaborate with provider, CPhT, and patient to pursue assistance.    Follow Up Plan: 1 week w/ PharmD  Mliss Tarry Griffin, PharmD, BCACP, CPP Clinical Pharmacist,  Medical Group   30 min of patient care was provided to the patient during this visit time.  This is a no charge visit

## 2024-01-23 ENCOUNTER — Other Ambulatory Visit: Payer: Self-pay | Admitting: Family Medicine

## 2024-02-13 ENCOUNTER — Other Ambulatory Visit: Payer: Self-pay | Admitting: Family Medicine

## 2024-02-13 DIAGNOSIS — E782 Mixed hyperlipidemia: Secondary | ICD-10-CM
